# Patient Record
Sex: Female | Born: 1955 | Race: Black or African American | Hispanic: No | Smoking: Current every day smoker
Health system: Southern US, Community
[De-identification: ages and names within clinical notes are randomized; demographics above are authoritative.]

## PROBLEM LIST (undated history)

## (undated) DIAGNOSIS — E7800 Pure hypercholesterolemia, unspecified: Secondary | ICD-10-CM

## (undated) DIAGNOSIS — J449 Chronic obstructive pulmonary disease, unspecified: Secondary | ICD-10-CM

## (undated) DIAGNOSIS — I2510 Atherosclerotic heart disease of native coronary artery without angina pectoris: Secondary | ICD-10-CM

## (undated) DIAGNOSIS — I700 Atherosclerosis of aorta: Secondary | ICD-10-CM

## (undated) DIAGNOSIS — M1990 Unspecified osteoarthritis, unspecified site: Secondary | ICD-10-CM

## (undated) DIAGNOSIS — R0600 Dyspnea, unspecified: Secondary | ICD-10-CM

## (undated) DIAGNOSIS — I10 Essential (primary) hypertension: Secondary | ICD-10-CM

## (undated) DIAGNOSIS — E119 Type 2 diabetes mellitus without complications: Secondary | ICD-10-CM

## (undated) DIAGNOSIS — I340 Nonrheumatic mitral (valve) insufficiency: Secondary | ICD-10-CM

## (undated) DIAGNOSIS — I5189 Other ill-defined heart diseases: Secondary | ICD-10-CM

## (undated) HISTORY — PX: ABDOMINAL HYSTERECTOMY: SHX81

---

## 2007-12-03 ENCOUNTER — Emergency Department: Payer: Self-pay | Admitting: Emergency Medicine

## 2009-01-07 ENCOUNTER — Emergency Department: Payer: Self-pay | Admitting: Unknown Physician Specialty

## 2014-09-26 DIAGNOSIS — I10 Essential (primary) hypertension: Secondary | ICD-10-CM | POA: Diagnosis present

## 2014-09-26 DIAGNOSIS — R002 Palpitations: Secondary | ICD-10-CM | POA: Insufficient documentation

## 2015-06-20 ENCOUNTER — Emergency Department: Payer: Self-pay

## 2015-06-20 ENCOUNTER — Encounter: Payer: Self-pay | Admitting: *Deleted

## 2015-06-20 ENCOUNTER — Emergency Department
Admission: EM | Admit: 2015-06-20 | Discharge: 2015-06-20 | Disposition: A | Discharge status: Home / Self Care | Payer: Self-pay | Attending: Emergency Medicine | Admitting: Emergency Medicine

## 2015-06-20 DIAGNOSIS — M1611 Unilateral primary osteoarthritis, right hip: Secondary | ICD-10-CM | POA: Insufficient documentation

## 2015-06-20 DIAGNOSIS — M1990 Unspecified osteoarthritis, unspecified site: Secondary | ICD-10-CM

## 2015-06-20 DIAGNOSIS — Z79899 Other long term (current) drug therapy: Secondary | ICD-10-CM | POA: Insufficient documentation

## 2015-06-20 DIAGNOSIS — I10 Essential (primary) hypertension: Secondary | ICD-10-CM | POA: Insufficient documentation

## 2015-06-20 DIAGNOSIS — Z720 Tobacco use: Secondary | ICD-10-CM | POA: Insufficient documentation

## 2015-06-20 DIAGNOSIS — Z791 Long term (current) use of non-steroidal anti-inflammatories (NSAID): Secondary | ICD-10-CM | POA: Insufficient documentation

## 2015-06-20 DIAGNOSIS — E7800 Pure hypercholesterolemia, unspecified: Secondary | ICD-10-CM | POA: Insufficient documentation

## 2015-06-20 DIAGNOSIS — R52 Pain, unspecified: Secondary | ICD-10-CM

## 2015-06-20 HISTORY — DX: Pure hypercholesterolemia, unspecified: E78.00

## 2015-06-20 HISTORY — DX: Essential (primary) hypertension: I10

## 2015-06-20 MED ORDER — MELOXICAM 7.5 MG PO TABS
7.5000 mg | ORAL_TABLET | Freq: Once | ORAL | Status: AC
  Administered 2015-06-20: 7.5 mg via ORAL

## 2015-06-20 MED ORDER — MELOXICAM 7.5 MG PO TABS
ORAL_TABLET | ORAL | Status: AC
  Administered 2015-06-20: 7.5 mg via ORAL
  Filled 2015-06-20: qty 1

## 2015-06-20 MED ORDER — MELOXICAM 15 MG PO TABS: 15.0000 mg | ORAL_TABLET | Freq: Every day | ORAL | Status: DC

## 2015-06-20 NOTE — ED Notes (Signed)
Has had pain in right leg, right hip, right knee, ache in hands past month

## 2015-06-20 NOTE — ED Provider Notes (Signed)
[facility] Emergency Department Provider Note  ____________________________________________  Time seen: Approximately 6:07 PM  I have reviewed the triage vital signs and the nursing notes.   HISTORY  Chief Complaint Muscle Pain    HPI Judy Martin is a 59 y.o. female presenting with a 1 year history of right leg pain and numbness. She states she has been experiencing a sharp, burning pain when ambulating that rates at 7/10. At rest, the pain is mostly relieved but the leg becomes stiff. She was seen about a year ago for this issue and x-rays were taken. She was told at this time that she had arthritis and given a medication that she can't remember the name of. The medication worked well, but she discontinued it about a month ago because she felt she was covering up a bigger issue. Since being off the medication, she has begun to experience b/l hand stiffness and numbness with intermittent decreases in grip strength and some locking up of the joints. These symptoms are relived by 500 mg Tylenol and a baby aspirin. Patient wanted to come get checked out today because she lives alone and wanted to make sure nothing serious was going on.    Past Medical History  Diagnosis Date  . Hypertension   . Hypercholesteremia     Patient Active Problem List   Diagnosis Date Noted  . Hypercholesteremia     Past Surgical History  Procedure Laterality Date  . Abdominal hysterectomy      Current Outpatient Rx  Name  Route  Sig  Dispense  Refill  . lisinopril (PRINIVIL,ZESTRIL) 20 MG tablet   Oral   Take 20 mg by mouth daily.         . simvastatin (ZOCOR) 40 MG tablet   Oral   Take 40 mg by mouth daily.         . meloxicam (MOBIC) 15 MG tablet   Oral   Take 1 tablet (15 mg total) by mouth daily.   30 tablet   2     Allergies Review of patient's allergies indicates no known allergies.  No family history on file.  Social History History  Substance  Use Topics  . Smoking status: Current Every Day Smoker  . Smokeless tobacco: Not on file  . Alcohol Use: Yes    Review of Systems Constitutional: No fever/chills Eyes: No visual changes. ENT: No sore throat. Cardiovascular: Denies chest pain. Respiratory: Denies shortness of breath. Gastrointestinal: No abdominal pain.  No nausea, no vomiting.  No diarrhea.  No constipation. Genitourinary: Negative for dysuria. Musculoskeletal: Negative for back pain. Positive for right leg pain and b/l hand stiffness.  Skin: Negative for rash. Neurological: Negative for headaches or focal weakness. Positive for b/l hand and right leg numbness.  10-point ROS otherwise negative.  ____________________________________________   PHYSICAL EXAM:  VITAL SIGNS: ED Triage Vitals  Enc Vitals Group     BP 06/20/15 1559 184/97 mmHg     Pulse Rate 06/20/15 1559 86     Resp 06/20/15 1559 20     Temp 06/20/15 1559 98.7 F (37.1 C)     Temp Source 06/20/15 1559 Oral     SpO2 06/20/15 1559 94 %     Weight 06/20/15 1559 167 lb (75.751 kg)     Height 06/20/15 1559 5\' 5"  (1.651 m)     Head Cir --      Peak Flow --      Pain Score 06/20/15 1607 8  Pain Loc --      Pain Edu? --      Excl. in [place]? --     Constitutional: Alert and oriented. Well appearing and in no acute distress. Eyes: Conjunctivae are normal. PERRL. EOMI. Head: Atraumatic. Nose: No congestion/rhinnorhea. Mouth/Throat: Mucous membranes are moist.  Oropharynx non-erythematous. Neck: No stridor. No cervical spine tenderness to palpation. Cardiovascular: Normal rate. Grossly normal heart sounds. Good peripheral circulation. Respiratory: Normal respiratory effort.  No retractions. Lungs CTAB. Gastrointestinal: Soft and nontender. No distention. No CVA tenderness. Musculoskeletal: ROM full and equal in b/l upper and lower extremities. Straight leg raise negative. No spinal TTP.  Neurologic:  Normal speech and language. No gross focal  neurologic deficits are appreciated. Speech is normal. Decreased sensation of left leg. Strength equal and appropriate in b/l upper and lower extremities.  Skin:  Skin is warm, dry and intact. No rash noted. Psychiatric: Mood and affect are normal. Speech and behavior are normal.  ____________________________________________   LABS (all labs ordered are listed, but only abnormal results are displayed)  Labs Reviewed - No data to display ____________________________________________  EKG  Not applicable ____________________________________________  RADIOLOGY  X-ray shows arthritis in the right femur. ____________________________________________    INITIAL IMPRESSION / ASSESSMENT AND PLAN / ED COURSE  Pertinent labs & imaging results that were available during my care of the patient were reviewed by me and considered in my medical decision making (see chart for details).  Diagnosed with arthralgia. Rx given for Mobic 15 mg 1 by mouth daily. One now prior to leaving. Patient to return follow up with PCP as directed. Patient understands she will return with any worsening symptomology and voices no other emergency medical complaints at this time ____________________________________________   FINAL CLINICAL IMPRESSION(S) / ED DIAGNOSES  Final diagnoses:  Pain aggravated by activities of daily living  Arthritis      Evangeline Dakin, PA-C 06/21/15 1610  Emily Filbert, MD 06/21/15 [phone]

## 2015-06-20 NOTE — ED Notes (Signed)
States she is having some pain from right hip into right leg for couple of mos. Now having pain to right arm grips are strong and equal

## 2015-06-20 NOTE — Discharge Instructions (Signed)
Arthritis, Nonspecific °Arthritis is inflammation of a joint. This usually means pain, redness, warmth or swelling are present. One or more joints may be involved. There are a number of types of arthritis. Your caregiver may not be able to tell what type of arthritis you have right away. °CAUSES  °The most common cause of arthritis is the wear and tear on the joint (osteoarthritis). This causes damage to the cartilage, which can break down over time. The knees, hips, back and neck are most often affected by this type of arthritis. °Other types of arthritis and common causes of joint pain include: °· Sprains and other injuries near the joint. Sometimes minor sprains and injuries cause pain and swelling that develop hours later. °· Rheumatoid arthritis. This affects hands, feet and knees. It usually affects both sides of your body at the same time. It is often associated with chronic ailments, fever, weight loss and general weakness. °· Crystal arthritis. Gout and pseudo gout can cause occasional acute severe pain, redness and swelling in the foot, ankle, or knee. °· Infectious arthritis. Bacteria can get into a joint through a break in overlying skin. This can cause infection of the joint. Bacteria and viruses can also spread through the blood and affect your joints. °· Drug, infectious and allergy reactions. Sometimes joints can become mildly painful and slightly swollen with these types of illnesses. °SYMPTOMS  °· Pain is the main symptom. °· Your joint or joints can also be red, swollen and warm or hot to the touch. °· You may have a fever with certain types of arthritis, or even feel overall ill. °· The joint with arthritis will hurt with movement. Stiffness is present with some types of arthritis. °DIAGNOSIS  °Your caregiver will suspect arthritis based on your description of your symptoms and on your exam. Testing may be needed to find the type of arthritis: °· Blood and sometimes urine tests. °· X-ray tests  and sometimes CT or MRI scans. °· Removal of fluid from the joint (arthrocentesis) is done to check for bacteria, crystals or other causes. Your caregiver (or a specialist) will numb the area over the joint with a local anesthetic, and use a needle to remove joint fluid for examination. This procedure is only minimally uncomfortable. °· Even with these tests, your caregiver may not be able to tell what kind of arthritis you have. Consultation with a specialist (rheumatologist) may be helpful. °TREATMENT  °Your caregiver will discuss with you treatment specific to your type of arthritis. If the specific type cannot be determined, then the following general recommendations may apply. °Treatment of severe joint pain includes: °· Rest. °· Elevation. °· Anti-inflammatory medication (for example, ibuprofen) may be prescribed. Avoiding activities that cause increased pain. °· Only take over-the-counter or prescription medicines for pain and discomfort as recommended by your caregiver. °· Cold packs over an inflamed joint may be used for 10 to 15 minutes every hour. Hot packs sometimes feel better, but do not use overnight. Do not use hot packs if you are diabetic without your caregiver's permission. °· A cortisone shot into arthritic joints may help reduce pain and swelling. °· Any acute arthritis that gets worse over the next 1 to 2 days needs to be looked at to be sure there is no joint infection. °Long-term arthritis treatment involves modifying activities and lifestyle to reduce joint stress jarring. This can include weight loss. Also, exercise is needed to nourish the joint cartilage and remove waste. This helps keep the muscles   around the joint strong. °HOME CARE INSTRUCTIONS  °· Do not take aspirin to relieve pain if gout is suspected. This elevates uric acid levels. °· Only take over-the-counter or prescription medicines for pain, discomfort or fever as directed by your caregiver. °· Rest the joint as much as  possible. °· If your joint is swollen, keep it elevated. °· Use crutches if the painful joint is in your leg. °· Drinking plenty of fluids may help for certain types of arthritis. °· Follow your caregiver's dietary instructions. °· Try low-impact exercise such as: °¨ Swimming. °¨ Water aerobics. °¨ Biking. °¨ Walking. °· Morning stiffness is often relieved by a warm shower. °· Put your joints through regular range-of-motion. °SEEK MEDICAL CARE IF:  °· You do not feel better in 24 hours or are getting worse. °· You have side effects to medications, or are not getting better with treatment. °SEEK IMMEDIATE MEDICAL CARE IF:  °· You have a fever. °· You develop severe joint pain, swelling or redness. °· Many joints are involved and become painful and swollen. °· There is severe back pain and/or leg weakness. °· You have loss of bowel or bladder control. °Document Released: 01/20/2005 Document Revised: 03/06/2012 Document Reviewed: 02/05/2009 °[employer]® Patient Information ©2015 [employer]. This information is not intended to replace advice given to you by your health care provider. Make sure you discuss any questions you have with your health care provider. ° °

## 2019-08-23 ENCOUNTER — Emergency Department: Payer: No Typology Code available for payment source

## 2019-08-23 ENCOUNTER — Other Ambulatory Visit: Payer: Self-pay

## 2019-08-23 ENCOUNTER — Emergency Department
Admission: EM | Admit: 2019-08-23 | Discharge: 2019-08-23 | Disposition: A | Discharge status: Home / Self Care | Payer: No Typology Code available for payment source | Attending: Emergency Medicine | Admitting: Emergency Medicine

## 2019-08-23 ENCOUNTER — Encounter: Payer: Self-pay | Admitting: Emergency Medicine

## 2019-08-23 DIAGNOSIS — M7918 Myalgia, other site: Secondary | ICD-10-CM

## 2019-08-23 DIAGNOSIS — Y93I9 Activity, other involving external motion: Secondary | ICD-10-CM | POA: Diagnosis not present

## 2019-08-23 DIAGNOSIS — M791 Myalgia, unspecified site: Secondary | ICD-10-CM | POA: Diagnosis not present

## 2019-08-23 DIAGNOSIS — M549 Dorsalgia, unspecified: Secondary | ICD-10-CM | POA: Diagnosis present

## 2019-08-23 DIAGNOSIS — Z79899 Other long term (current) drug therapy: Secondary | ICD-10-CM | POA: Diagnosis not present

## 2019-08-23 DIAGNOSIS — Y999 Unspecified external cause status: Secondary | ICD-10-CM | POA: Insufficient documentation

## 2019-08-23 DIAGNOSIS — Y92410 Unspecified street and highway as the place of occurrence of the external cause: Secondary | ICD-10-CM | POA: Diagnosis not present

## 2019-08-23 DIAGNOSIS — I10 Essential (primary) hypertension: Secondary | ICD-10-CM | POA: Insufficient documentation

## 2019-08-23 DIAGNOSIS — F17210 Nicotine dependence, cigarettes, uncomplicated: Secondary | ICD-10-CM | POA: Diagnosis not present

## 2019-08-23 MED ORDER — METHOCARBAMOL 500 MG PO TABS: 500.0000 mg | ORAL_TABLET | Freq: Every evening | ORAL | 0 refills | Status: DC | PRN

## 2019-08-23 MED ORDER — NAPROXEN 500 MG PO TABS: 500.0000 mg | ORAL_TABLET | Freq: Two times a day (BID) | ORAL | 0 refills | Status: DC
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
## 2019-08-23 NOTE — Discharge Instructions (Addendum)
Follow-up with your primary care provider if any continued problems.  Begin taking your medication as directed.  These medicines were sent to the pharmacy.  There is a muscle relaxant included with this prescription which should only be taken at bedtime as it may cause drowsiness and increase her risk for falling.  You may use ice or heat to your neck and muscles as needed for discomfort.

## 2019-08-23 NOTE — ED Notes (Signed)
Awaiting x-rays

## 2022-05-04 ENCOUNTER — Other Ambulatory Visit: Payer: Self-pay | Admitting: Internal Medicine

## 2022-05-04 DIAGNOSIS — Z1231 Encounter for screening mammogram for malignant neoplasm of breast: Secondary | ICD-10-CM

## 2022-05-12 ENCOUNTER — Ambulatory Visit
Admission: RE | Admit: 2022-05-12 | Discharge: 2022-05-12 | Disposition: A | Discharge status: Home / Self Care | Payer: Medicare Other | Source: Ambulatory Visit | Attending: Internal Medicine | Admitting: Internal Medicine

## 2022-05-12 DIAGNOSIS — Z1231 Encounter for screening mammogram for malignant neoplasm of breast: Secondary | ICD-10-CM | POA: Insufficient documentation

## 2022-05-14 ENCOUNTER — Other Ambulatory Visit: Payer: Self-pay | Admitting: *Deleted

## 2022-05-14 ENCOUNTER — Inpatient Hospital Stay
Admission: RE | Admit: 2022-05-14 | Discharge: 2022-05-14 | Disposition: A | Discharge status: Home / Self Care | Payer: Self-pay | Source: Ambulatory Visit | Attending: *Deleted | Admitting: *Deleted

## 2022-05-14 DIAGNOSIS — Z1231 Encounter for screening mammogram for malignant neoplasm of breast: Secondary | ICD-10-CM

## 2022-05-20 ENCOUNTER — Other Ambulatory Visit: Payer: Self-pay | Admitting: Internal Medicine

## 2022-05-20 DIAGNOSIS — R928 Other abnormal and inconclusive findings on diagnostic imaging of breast: Secondary | ICD-10-CM

## 2022-05-20 DIAGNOSIS — N63 Unspecified lump in unspecified breast: Secondary | ICD-10-CM

## 2022-06-02 ENCOUNTER — Ambulatory Visit
Admission: RE | Admit: 2022-06-02 | Discharge: 2022-06-02 | Disposition: A | Discharge status: Home / Self Care | Payer: Medicare Other | Source: Ambulatory Visit | Attending: Internal Medicine | Admitting: Internal Medicine

## 2022-06-02 DIAGNOSIS — R928 Other abnormal and inconclusive findings on diagnostic imaging of breast: Secondary | ICD-10-CM | POA: Diagnosis present

## 2022-06-02 DIAGNOSIS — N63 Unspecified lump in unspecified breast: Secondary | ICD-10-CM

## 2022-07-04 ENCOUNTER — Emergency Department: Payer: Medicare Other

## 2022-07-04 ENCOUNTER — Emergency Department
Admission: EM | Admit: 2022-07-04 | Discharge: 2022-07-04 | Disposition: A | Discharge status: Home / Self Care | Payer: Medicare Other | Attending: Emergency Medicine | Admitting: Emergency Medicine

## 2022-07-04 ENCOUNTER — Other Ambulatory Visit: Payer: Self-pay

## 2022-07-04 DIAGNOSIS — R0602 Shortness of breath: Secondary | ICD-10-CM | POA: Diagnosis present

## 2022-07-04 DIAGNOSIS — Z20822 Contact with and (suspected) exposure to covid-19: Secondary | ICD-10-CM | POA: Insufficient documentation

## 2022-07-04 LAB — BASIC METABOLIC PANEL
Anion gap: 8 (ref 5–15)
BUN: 9 mg/dL (ref 8–23)
CO2: 23 mmol/L (ref 22–32)
Calcium: 9.3 mg/dL (ref 8.9–10.3)
Chloride: 112 mmol/L — ABNORMAL HIGH (ref 98–111)
Creatinine, Ser: 0.74 mg/dL (ref 0.44–1.00)
GFR, Estimated: >60 mL/min (ref >60)
Glucose, Bld: 115 mg/dL — ABNORMAL HIGH (ref 70–99)
Potassium: 4.1 mmol/L (ref 3.5–5.1)
Sodium: 143 mmol/L (ref 135–145)

## 2022-07-04 LAB — SARS CORONAVIRUS 2 BY RT PCR: SARS Coronavirus 2 by RT PCR: NEGATIVE

## 2022-07-04 LAB — CBC
HCT: 48.9 % — ABNORMAL HIGH (ref 36.0–46.0)
Hemoglobin: 16.4 g/dL — ABNORMAL HIGH (ref 12.0–15.0)
MCH: 33.5 pg (ref 26.0–34.0)
MCHC: 33.5 g/dL (ref 30.0–36.0)
MCV: 100 fL (ref 80.0–100.0)
Platelets: 247 10*3/uL (ref 150–400)
RBC: 4.89 MIL/uL (ref 3.87–5.11)
RDW: 13.2 % (ref 11.5–15.5)
WBC: 10 10*3/uL (ref 4.0–10.5)
nRBC: 0 % (ref 0.0–0.2)

## 2022-07-04 LAB — D-DIMER, QUANTITATIVE: D-Dimer, Quant: 0.48 ug/mL-FEU (ref 0.00–0.50)

## 2022-07-04 MED ORDER — IPRATROPIUM-ALBUTEROL 0.5-2.5 (3) MG/3ML IN SOLN
3.0000 mL | Freq: Once | RESPIRATORY_TRACT | Status: AC
  Administered 2022-07-04: 3 mL via RESPIRATORY_TRACT
  Filled 2022-07-04: qty 3

## 2022-07-04 MED ORDER — ALBUTEROL SULFATE (2.5 MG/3ML) 0.083% IN NEBU: 3.0000 mL | INHALATION_SOLUTION | RESPIRATORY_TRACT | Status: DC | PRN

## 2022-07-04 MED ORDER — ACETAMINOPHEN 500 MG PO TABS
1000.0000 mg | ORAL_TABLET | Freq: Once | ORAL | Status: AC
  Administered 2022-07-04: 1000 mg via ORAL
  Filled 2022-07-04: qty 2

## 2022-07-04 MED ORDER — ALBUTEROL SULFATE HFA 108 (90 BASE) MCG/ACT IN AERS
2.0000 | INHALATION_SPRAY | Freq: Four times a day (QID) | RESPIRATORY_TRACT | 2 refills | Status: DC | PRN
Start: 2022-07-04 — End: 2023-07-21

## 2022-07-04 MED ORDER — METHYLPREDNISOLONE SODIUM SUCC 125 MG IJ SOLR
125.0000 mg | Freq: Once | INTRAMUSCULAR | Status: AC
Start: 2022-07-04 — End: 2022-07-04
  Administered 2022-07-04: 125 mg via INTRAVENOUS
  Filled 2022-07-04: qty 2

## 2022-07-04 MED ORDER — PREDNISONE 10 MG (21) PO TBPK: ORAL_TABLET | ORAL | 0 refills | Status: DC

## 2022-07-04 NOTE — Discharge Instructions (Signed)
Please seek medical attention for any high fevers, chest pain, shortness of breath, change in behavior, persistent vomiting, bloody stool or any other new or concerning symptoms.  

## 2022-07-04 NOTE — ED Provider Notes (Signed)
[facility] Provider Note    Event Date/Time   First MD Initiated Contact with Patient 07/04/22 2035     (approximate)   History   Shortness of Breath   HPI {Remember to add pertinent medical, surgical, social, and/or OB history to HPI:1} Judy Martin is a 66 y.o. female  ***       Physical Exam   Triage Vital Signs: ED Triage Vitals  Enc Vitals Group     BP 07/04/22 1957 (!) 159/95     Pulse Rate 07/04/22 1957 (!) 101     Resp 07/04/22 1957 20     Temp 07/04/22 1957 98.7 F (37.1 C)     Temp Source 07/04/22 1957 Oral     SpO2 07/04/22 1953 92 %     Weight 07/04/22 1954 160 lb (72.6 kg)     Height 07/04/22 1954 5\' 5"  (1.651 m)     Head Circumference --      Peak Flow --      Pain Score 07/04/22 1954 0     Pain Loc --      Pain Edu? --      Excl. in [place]? --     Most recent vital signs: Vitals:   07/04/22 2030 07/04/22 2032  BP: (!) 165/88   Pulse: (!) 101   Resp: 20   Temp:    SpO2: 90% 95%    {Only need to document appropriate and relevant physical exam:1} General: Awake, no distress. *** CV:  Good peripheral perfusion. *** Resp:  Normal effort. *** Abd:  No distention. *** Other:  ***   ED Results / Procedures / Treatments   Labs (all labs ordered are listed, but only abnormal results are displayed) Labs Reviewed  BASIC METABOLIC PANEL - Abnormal; Notable for the following components:      Result Value   Chloride 112 (*)    Glucose, Bld 115 (*)    All other components within normal limits  CBC - Abnormal; Notable for the following components:   Hemoglobin 16.4 (*)    HCT 48.9 (*)    All other components within normal limits  D-DIMER, QUANTITATIVE     EKG  I, 2033, attending physician, personally viewed and interpreted this EKG  EKG Time: 1957 Rate: 101 Rhythm: sinus tachycardia with pac Axis: normal Intervals: qtc 453 QRS: narrow, q waves v1 ST changes: no st elevation Impression: abnormal  ekg   RADIOLOGY I independently interpreted and visualized the CXR. My interpretation: No pneumonia. No pneumothorax. Radiology interpretation:  IMPRESSION:  Cardiomegaly and pulmonary hyperinflation without acute abnormality  of the lungs.     PROCEDURES:  Critical Care performed: {CriticalCareYesNo:19197::"Yes, see critical care procedure note(s)","No"}  Procedures   MEDICATIONS ORDERED IN ED: Medications  albuterol (PROVENTIL) (2.5 MG/3ML) 0.083% nebulizer solution 3 mL (has no administration in time range)     IMPRESSION / MDM / ASSESSMENT AND PLAN / ED COURSE  I reviewed the triage vital signs and the nursing notes.                              Differential diagnosis includes, but is not limited to, ***  Patient's presentation is most consistent with {EM COPA:27473}  {If the patient is on the monitor, remove the brackets and asterisks on the sentence below and remember to document it as a Procedure as well. Otherwise delete the sentence below:1} {**The patient is  on the cardiac monitor to evaluate for evidence of arrhythmia and/or significant heart rate changes.**} {Remember to include, when applicable, any/all of the following data: independent review of imaging independent review of labs (comment specifically on pertinent positives and negatives) review of specific prior hospitalizations, PCP/specialist notes, etc. discuss meds given and prescribed document any discussion with consultants (including hospitalists) any clinical decision tools you used and why (PECARN, NEXUS, etc.) did you consider admitting the patient? document social determinants of health affecting patient's care (homelessness, inability to follow up in a timely fashion, etc) document any pre-existing conditions increasing risk on current visit (e.g. diabetes and HTN increasing danger of high-risk chest pain/ACS) describes what meds you gave (especially parenteral) and why any other  interventions?:1}     FINAL CLINICAL IMPRESSION(S) / ED DIAGNOSES   Final diagnoses:  None     Rx / DC Orders   ED Discharge Orders     None        Note:  This document was prepared using Dragon voice recognition software and may include unintentional dictation errors.

## 2022-07-04 NOTE — ED Notes (Signed)
Patient transported to X-ray. RN aware of pt.

## 2022-07-04 NOTE — ED Notes (Signed)
Pt sats decreased to 89% on room air, pt placed on O2 2 L [place], sats improved to 98% on 2 l [place]

## 2022-07-04 NOTE — ED Triage Notes (Signed)
Pt with c/o Shob since last evening, pt is current smoker, about a pack a day. Pt denies swelling or asthma. Pt states Sob is worse when walking. Pt denies pain.

## 2023-01-26 ENCOUNTER — Observation Stay
Admission: EM | Admit: 2023-01-26 | Discharge: 2023-01-27 | Disposition: A | Discharge status: Home / Self Care | Payer: Medicare Other | Attending: Obstetrics and Gynecology | Admitting: Obstetrics and Gynecology

## 2023-01-26 ENCOUNTER — Other Ambulatory Visit: Payer: Self-pay

## 2023-01-26 ENCOUNTER — Emergency Department: Payer: Medicare Other

## 2023-01-26 ENCOUNTER — Observation Stay (HOSPITAL_BASED_OUTPATIENT_CLINIC_OR_DEPARTMENT_OTHER)
Admit: 2023-01-26 | Discharge: 2023-01-26 | Disposition: A | Discharge status: Home / Self Care | Payer: Medicare Other | Attending: Internal Medicine | Admitting: Internal Medicine

## 2023-01-26 ENCOUNTER — Observation Stay: Payer: Medicare Other

## 2023-01-26 DIAGNOSIS — J9601 Acute respiratory failure with hypoxia: Secondary | ICD-10-CM | POA: Diagnosis not present

## 2023-01-26 DIAGNOSIS — Z79899 Other long term (current) drug therapy: Secondary | ICD-10-CM | POA: Diagnosis not present

## 2023-01-26 DIAGNOSIS — D751 Secondary polycythemia: Secondary | ICD-10-CM | POA: Diagnosis not present

## 2023-01-26 DIAGNOSIS — R0602 Shortness of breath: Secondary | ICD-10-CM | POA: Insufficient documentation

## 2023-01-26 DIAGNOSIS — J479 Bronchiectasis, uncomplicated: Secondary | ICD-10-CM | POA: Diagnosis not present

## 2023-01-26 DIAGNOSIS — F17290 Nicotine dependence, other tobacco product, uncomplicated: Secondary | ICD-10-CM | POA: Diagnosis not present

## 2023-01-26 DIAGNOSIS — Z1152 Encounter for screening for COVID-19: Secondary | ICD-10-CM | POA: Insufficient documentation

## 2023-01-26 DIAGNOSIS — J441 Chronic obstructive pulmonary disease with (acute) exacerbation: Secondary | ICD-10-CM | POA: Diagnosis not present

## 2023-01-26 DIAGNOSIS — I10 Essential (primary) hypertension: Secondary | ICD-10-CM | POA: Diagnosis not present

## 2023-01-26 DIAGNOSIS — J471 Bronchiectasis with (acute) exacerbation: Secondary | ICD-10-CM

## 2023-01-26 DIAGNOSIS — E876 Hypokalemia: Secondary | ICD-10-CM | POA: Diagnosis not present

## 2023-01-26 DIAGNOSIS — E119 Type 2 diabetes mellitus without complications: Secondary | ICD-10-CM | POA: Diagnosis not present

## 2023-01-26 DIAGNOSIS — I272 Pulmonary hypertension, unspecified: Secondary | ICD-10-CM | POA: Diagnosis not present

## 2023-01-26 LAB — CBC
HCT: 45.3 % (ref 36.0–46.0)
Hemoglobin: 15.3 g/dL — ABNORMAL HIGH (ref 12.0–15.0)
MCH: 33.8 pg (ref 26.0–34.0)
MCHC: 33.8 g/dL (ref 30.0–36.0)
MCV: 100 fL (ref 80.0–100.0)
Platelets: 329 10*3/uL (ref 150–400)
RBC: 4.53 MIL/uL (ref 3.87–5.11)
RDW: 13.7 % (ref 11.5–15.5)
WBC: 7.6 10*3/uL (ref 4.0–10.5)
nRBC: 0 % (ref 0.0–0.2)

## 2023-01-26 LAB — BRAIN NATRIURETIC PEPTIDE: B Natriuretic Peptide: 66.7 pg/mL (ref 0.0–100.0)

## 2023-01-26 LAB — BASIC METABOLIC PANEL
Anion gap: 6 (ref 5–15)
BUN: 9 mg/dL (ref 8–23)
CO2: 31 mmol/L (ref 22–32)
Calcium: 8.9 mg/dL (ref 8.9–10.3)
Chloride: 104 mmol/L (ref 98–111)
Creatinine, Ser: 0.7 mg/dL (ref 0.44–1.00)
GFR, Estimated: >60 mL/min (ref >60)
Glucose, Bld: 105 mg/dL — ABNORMAL HIGH (ref 70–99)
Potassium: 2.8 mmol/L — ABNORMAL LOW (ref 3.5–5.1)
Sodium: 141 mmol/L (ref 135–145)

## 2023-01-26 LAB — RESP PANEL BY RT-PCR (RSV, FLU A&B, COVID)  RVPGX2
Influenza A by PCR: NEGATIVE
Influenza B by PCR: NEGATIVE
Resp Syncytial Virus by PCR: NEGATIVE
SARS Coronavirus 2 by RT PCR: NEGATIVE

## 2023-01-26 LAB — D-DIMER, QUANTITATIVE: D-Dimer, Quant: 0.46 ug/mL-FEU (ref 0.00–0.50)

## 2023-01-26 MED ORDER — POTASSIUM CHLORIDE CRYS ER 20 MEQ PO TBCR
40.0000 meq | EXTENDED_RELEASE_TABLET | Freq: Once | ORAL | Status: AC
  Administered 2023-01-26: 40 meq via ORAL
  Filled 2023-01-26: qty 2

## 2023-01-26 MED ORDER — AMLODIPINE BESYLATE 10 MG PO TABS
10.0000 mg | ORAL_TABLET | Freq: Every day | ORAL | Status: DC
  Administered 2023-01-26: 10 mg via ORAL
  Filled 2023-01-26: qty 1

## 2023-01-26 MED ORDER — ASPIRIN 81 MG PO TBEC
81.0000 mg | DELAYED_RELEASE_TABLET | Freq: Every day | ORAL | Status: DC
  Administered 2023-01-26 – 2023-01-27 (×2): 81 mg via ORAL
  Filled 2023-01-26 (×2): qty 1

## 2023-01-26 MED ORDER — IPRATROPIUM-ALBUTEROL 0.5-2.5 (3) MG/3ML IN SOLN
3.0000 mL | Freq: Once | RESPIRATORY_TRACT | Status: AC
  Administered 2023-01-26: 3 mL via RESPIRATORY_TRACT
  Filled 2023-01-26: qty 3

## 2023-01-26 MED ORDER — PREDNISONE 20 MG PO TABS
40.0000 mg | ORAL_TABLET | Freq: Every day | ORAL | Status: DC
  Administered 2023-01-27: 40 mg via ORAL
  Filled 2023-01-26: qty 2

## 2023-01-26 MED ORDER — ATENOLOL 25 MG PO TABS
100.0000 mg | ORAL_TABLET | Freq: Every day | ORAL | Status: DC
  Administered 2023-01-27: 100 mg via ORAL
  Filled 2023-01-26: qty 4

## 2023-01-26 MED ORDER — ENOXAPARIN SODIUM 40 MG/0.4ML IJ SOSY
40.0000 mg | PREFILLED_SYRINGE | INTRAMUSCULAR | Status: DC
  Administered 2023-01-26: 40 mg via SUBCUTANEOUS
  Filled 2023-01-26: qty 0.4

## 2023-01-26 MED ORDER — IRBESARTAN 150 MG PO TABS
300.0000 mg | ORAL_TABLET | Freq: Every day | ORAL | Status: DC
  Administered 2023-01-26 – 2023-01-27 (×2): 300 mg via ORAL
  Filled 2023-01-26 (×2): qty 2

## 2023-01-26 MED ORDER — POTASSIUM CHLORIDE CRYS ER 10 MEQ PO TBCR
10.0000 meq | EXTENDED_RELEASE_TABLET | Freq: Every day | ORAL | Status: DC
  Administered 2023-01-27: 10 meq via ORAL
  Filled 2023-01-26: qty 1

## 2023-01-26 MED ORDER — ATORVASTATIN CALCIUM 20 MG PO TABS
80.0000 mg | ORAL_TABLET | Freq: Every day | ORAL | Status: DC
  Administered 2023-01-26: 80 mg via ORAL
  Filled 2023-01-26: qty 4

## 2023-01-26 MED ORDER — UMECLIDINIUM-VILANTEROL 62.5-25 MCG/ACT IN AEPB
1.0000 | INHALATION_SPRAY | Freq: Every day | RESPIRATORY_TRACT | Status: DC
  Administered 2023-01-27: 1 via RESPIRATORY_TRACT
  Filled 2023-01-26: qty 14

## 2023-01-26 MED ORDER — IPRATROPIUM-ALBUTEROL 0.5-2.5 (3) MG/3ML IN SOLN
3.0000 mL | Freq: Four times a day (QID) | RESPIRATORY_TRACT | Status: DC
  Administered 2023-01-26 – 2023-01-27 (×3): 3 mL via RESPIRATORY_TRACT
  Filled 2023-01-26 (×2): qty 3

## 2023-01-26 MED ORDER — METHYLPREDNISOLONE SODIUM SUCC 125 MG IJ SOLR
125.0000 mg | Freq: Once | INTRAMUSCULAR | Status: AC
Start: 2023-01-26 — End: 2023-01-26
  Administered 2023-01-26: 125 mg via INTRAVENOUS
  Filled 2023-01-26: qty 2

## 2023-01-26 NOTE — Assessment & Plan Note (Addendum)
Blood pressure above goal on admission.  Potentially will need further titration of her antihypertensives, but will continue home doses for now.  - Continue home antihypertensives

## 2023-01-26 NOTE — ED Triage Notes (Signed)
First Nurse Note:  C/o difficulty breathing x 1 month.  AAOx3.  Skin warm and dry. NAD

## 2023-01-26 NOTE — Assessment & Plan Note (Addendum)
Likely in the setting of COPD exacerbation with evidence of diaphragmatic flattening on chest x-ray, however interestingly, she does not have any wheezing on examination today.  Per chart review, she had an ED visit several months ago at which time she was noted to be wheezing extensively though.  She will need formal PFTs 4 to 6 weeks after resolution of acute issues.  For further evaluation of other etiology, such as ILD, will order CT chest.  - Continue supplemental oxygen to maintain oxygen saturation above 88% - Wean as tolerated - CT chest without contrast - S/p Solu-Medrol 125 mg once - Start prednisone 40 mg tomorrow to complete a 5-day course - RVP pending - DuoNebs every 6 hours - Start maintenance bronchodilators tomorrow

## 2023-01-26 NOTE — H&P (Signed)
History and Physical    PatientDimitri Martin IEP:[id number] DOB: 09/25/1956 DOA: 01/26/2023 DOS: the patient was seen and examined on 01/26/2023 PCP: [facility]  Patient coming from: Home  Chief Complaint:  Chief Complaint  Patient presents with   Shortness of Breath   HPI: Judy Martin is a 67 y.o. female with medical history significant of HTN and HLD who presents to the ED with c/o SOB.   Judy Martin states that for the last 1 month, she has been experiencing gradually worsening shortness of breath both at rest and with exertion.  She denies any cough, rhinorrhea, congestion, or chest pain.  She denies any lower extremity swelling or orthopnea.  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  Judy Martin states that she used to smoke heavily when she was younger but quit in her 10s.  Approximately 1 or 2 years ago, she picked back up with smoking but smokes 3 to 4 cigars daily.  She was uncertain if she had been diagnosed with COPD in the past, but suspects that is what is happening now.  ED Course:  On arrival to the ED, patient was hypertensive at 161/87 with heart rate of 98.  She was saturating at 84% on room air with improvement to 95% on 3 L. Initial workup remarkable for WBC of 7.6, BNP of 66, D-dimer of 0.46, potassium of 2.8, creatinine 0.70 and GFR above 60.  Chest x-ray was obtained that did not show any acute cardiopulmonary abnormalities.  COVID-19, influenza and RSV PCR negative.  Due to acute hypoxic respiratory failure, [facility] contacted for admission.   Review of Systems: As mentioned in the history of present illness. All other systems reviewed and are negative.  Past Medical History:  Diagnosis Date   Hypercholesteremia    Hypertension    Past Surgical History:  Procedure Laterality Date   ABDOMINAL HYSTERECTOMY     Social History:  reports that she has been  smoking. She does not have any smokeless tobacco history on file. She reports current alcohol use. No history on file for drug use.  No Known Allergies  History reviewed. No pertinent family history.  Prior to Admission medications   Medication Sig Start Date End Date Taking? Authorizing Provider  albuterol (VENTOLIN HFA) 108 (90 Base) MCG/ACT inhaler Inhale 2 puffs into the lungs every 6 (six) hours as needed for wheezing or shortness of breath. 07/04/22   Nance Pear, MD  lisinopril (PRINIVIL,ZESTRIL) 20 MG tablet Take 20 mg by mouth daily.    [provider]  methocarbamol (ROBAXIN) 500 MG tablet Take 1 tablet (500 mg total) by mouth at bedtime as needed. 08/23/19   Johnn Hai, PA-C  naproxen (NAPROSYN) 500 MG tablet Take 1 tablet (500 mg total) by mouth 2 (two) times daily with a meal. 08/23/19   Johnn Hai, PA-C  predniSONE (STERAPRED UNI-PAK 21 TAB) 10 MG (21) TBPK tablet Per packaging instructions 07/04/22   Nance Pear, MD  simvastatin (ZOCOR) 40 MG tablet Take 40 mg by mouth daily.    [provider]    Physical Exam: Vitals:   01/26/23 1530 01/26/23 1600 01/26/23 1630 01/26/23 1655  BP: (!) 157/83 (!) 154/92 (!) 165/81   Pulse: 86 85 88   Resp: 18     Temp:      SpO2: 94% 94% 94%   Weight:  71.7 kg  Height:    5\' 5"  (1.651 m)   Physical Exam Vitals and nursing note reviewed.  Constitutional:      General: She is not in acute distress.    Appearance: She is normal weight. She is not toxic-appearing.  HENT:     Head: Normocephalic and atraumatic.     Mouth/Throat:     Mouth: Mucous membranes are moist.     Pharynx: Oropharynx is clear.  Eyes:     Extraocular Movements: Extraocular movements intact.     Pupils: Pupils are equal, round, and reactive to light.  Cardiovascular:     Rate and Rhythm: Normal rate and regular rhythm.     Heart sounds: No murmur heard. Pulmonary:     Effort: Pulmonary effort is normal. No tachypnea or  accessory muscle usage.     Breath sounds: No decreased breath sounds, wheezing, rhonchi or rales.  Abdominal:     General: Bowel sounds are normal.     Palpations: Abdomen is soft.  Musculoskeletal:     Cervical back: Neck supple.     Right lower leg: No edema.     Left lower leg: No edema.  Skin:    General: Skin is warm and dry.  Neurological:     General: No focal deficit present.     Mental Status: She is alert and oriented to person, place, and time.  Psychiatric:        Mood and Affect: Mood normal.        Behavior: Behavior normal.    Data Reviewed: CBC with WBC of 7.6, hemoglobin of 15.3, MCV of 100 and platelets of 329 BMP with potassium of 2.8, sodium of 141, chloride 104, bicarb 31, glucose 105, BUN 9, creatinine 0.70 with GFR above 60 BNP within normal limits at 66 D-dimer within normal limits at 0.46 COVID-19, influenza and RSV PCR negative  Chest x-ray personally reviewed.  No evidence of opacities noted, however diaphragmatic flattening noted.  No pleural effusions.  EKG personally reviewed.  Sinus rhythm with rate of 96.  PVC and PAC noted.  No ST or T wave changes concerning for acute ischemia.  There are no new results to review at this time.  Assessment and Plan:  * Acute hypoxic respiratory failure (HCC) Likely in the setting of COPD exacerbation with evidence of diaphragmatic flattening on chest x-ray, however interestingly, she does not have any wheezing on examination today.  Per chart review, she had an ED visit several months ago at which time she was noted to be wheezing extensively though.  She will need formal PFTs 4 to 6 weeks after resolution of acute issues.  For further evaluation of other etiology, such as ILD, will order CT chest.  - Continue supplemental oxygen to maintain oxygen saturation above 88% - Wean as tolerated - CT chest without contrast - S/p Solu-Medrol 125 mg once - Start prednisone 40 mg tomorrow to complete a 5-day course -  RVP pending - DuoNebs every 6 hours - Start maintenance bronchodilators tomorrow  Hypokalemia Acute on chronic.  Potentially in the setting of lisinopril.  No EKG changes.  - S/p potassium chloride 40 mEq - Repeat BMP in the a.m.  Polycythemia Hemoglobin elevated on CBC, and compared to prior CBC, appears chronic.  Likely in the setting of tobacco use disorder and COPD.  Essential hypertension Blood pressure above goal on admission.  Potentially will need further titration of her antihypertensives, but will continue home doses for now.  -  Continue home antihypertensives  Advance Care Planning:   Code Status: Full Code   Consults: None  Family Communication: No family at bedside  Severity of Illness: The appropriate patient status for this patient is OBSERVATION. Observation status is judged to be reasonable and necessary in order to provide the required intensity of service to ensure the patient's safety. The patient's presenting symptoms, physical exam findings, and initial radiographic and laboratory data in the context of their medical condition is felt to place them at decreased risk for further clinical deterioration. Furthermore, it is anticipated that the patient will be medically stable for discharge from the hospital within 2 midnights of admission.   Author: Jose Persia, MD 01/26/2023 5:00 PM  For on call review [email or web address].

## 2023-01-26 NOTE — Assessment & Plan Note (Signed)
Hemoglobin elevated on CBC, and compared to prior CBC, appears chronic.  Likely in the setting of tobacco use disorder and COPD.

## 2023-01-26 NOTE — Assessment & Plan Note (Addendum)
Acute on chronic.  Potentially in the setting of lisinopril.  No EKG changes.  - S/p potassium chloride 40 mEq - Repeat BMP in the a.m.

## 2023-01-26 NOTE — ED Notes (Signed)
Pulse ox nose clamp was applied on pt's left ear at this time for accurate reading. Pt is maintaining at 97% on 3L [place] at this time.

## 2023-01-26 NOTE — ED Notes (Signed)
Patient transported to CT 

## 2023-01-26 NOTE — ED Notes (Addendum)
Pt placed on 2L [place] at this time and O2 improved to 91%

## 2023-01-26 NOTE — ED Provider Notes (Signed)
[facility] Provider Note    Event Date/Time   First MD Initiated Contact with Patient 01/26/23 1459     (approximate)   History   Shortness of Breath   HPI  Judy Martin is a 67 y.o. female who presents to the ER for evaluation of shortness of breath is getting progressively worse over the past month.  She doe smoke more than a pack a day.  No history of COPD bronchitis no history of asthma.  No history of CHF.  Has not had any cough or chills.  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  Denies any history of DVT or PE.  She went to medical clinic today was found to be hypoxic on room air rest was sent to the ER for further evaluation.     Physical Exam   Triage Vital Signs: ED Triage Vitals  Enc Vitals Group     BP 01/26/23 1244 (!) 161/87     Pulse Rate 01/26/23 1244 98     Resp 01/26/23 1244 20     Temp 01/26/23 1244 98 F (36.7 C)     Temp src --      SpO2 01/26/23 1244 (!) 84 %     Weight --      Height --      Head Circumference --      Peak Flow --      Pain Score 01/26/23 1243 0     Pain Loc --      Pain Edu? --      Excl. in [place]? --     Most recent vital signs: Vitals:   01/26/23 1500 01/26/23 1530  BP: (!) 158/89 (!) 157/83  Pulse: 89 86  Resp: 18 18  Temp:    SpO2: 95% 94%     Constitutional: Alert  Eyes: Conjunctivae are normal.  Head: Atraumatic. Nose: No congestion/rhinnorhea. Mouth/Throat: Mucous membranes are moist.   Neck: Painless ROM.  Cardiovascular:   Good peripheral circulation. Respiratory: Normal respiratory effort.  No retractions.  Gastrointestinal: Soft and nontender.  Musculoskeletal:  no deformity Neurologic:  MAE spontaneously. No gross focal neurologic deficits are appreciated.  Skin:  Skin is warm, dry and intact. No rash noted. Psychiatric: Mood and affect are normal. Speech and behavior are normal.    ED Results / Procedures / Treatments   Labs (all labs  ordered are listed, but only abnormal results are displayed) Labs Reviewed  BASIC METABOLIC PANEL - Abnormal; Notable for the following components:      Result Value   Potassium 2.8 (*)    Glucose, Bld 105 (*)    All other components within normal limits  CBC - Abnormal; Notable for the following components:   Hemoglobin 15.3 (*)    All other components within normal limits  RESP PANEL BY RT-PCR (RSV, FLU A&B, COVID)  [id number]  D-DIMER, QUANTITATIVE  BRAIN NATRIURETIC PEPTIDE     EKG  ED ECG REPORT I, Merlyn Lot, the attending physician, personally viewed and interpreted this ECG.   Date: 01/26/2023  EKG Time: 12:54  Rate: 95  Rhythm: sinus  Axis: normal  Intervals: borderline prolonged qt  ST&T Change: no stemi, no depressions    RADIOLOGY Please see ED Course for my review and interpretation.  I personally reviewed all radiographic images ordered to evaluate for the above acute complaints and reviewed radiology reports and findings.  These findings were personally discussed with the patient.  Please see medical record for radiology report.    PROCEDURES:  Critical Care performed: Yes, see critical care procedure note(s)  .Critical Care  Performed by: Merlyn Lot, MD Authorized by: Merlyn Lot, MD   Critical care provider statement:    Critical care time (minutes):  35   Critical care was necessary to treat or prevent imminent or life-threatening deterioration of the following conditions:  Respiratory failure   Critical care was time spent personally by me on the following activities:  Ordering and performing treatments and interventions, ordering and review of laboratory studies, ordering and review of radiographic studies, pulse oximetry, re-evaluation of patient's condition, review of old charts, obtaining history from patient or surrogate, examination of patient, evaluation of patient's response to treatment, discussions with primary provider,  discussions with consultants and development of treatment plan with patient or surrogate    MEDICATIONS ORDERED IN ED: Medications  ipratropium-albuterol (DUONEB) 0.5-2.5 (3) MG/3ML nebulizer solution 3 mL (has no administration in time range)  potassium chloride SA (KLOR-CON M) CR tablet 40 mEq (has no administration in time range)  ipratropium-albuterol (DUONEB) 0.5-2.5 (3) MG/3ML nebulizer solution 3 mL (3 mLs Nebulization Given 01/26/23 1513)  methylPREDNISolone sodium succinate (SOLU-MEDROL) 125 mg/2 mL injection 125 mg (125 mg Intravenous Given 01/26/23 1536)     IMPRESSION / MDM / [place] / ED COURSE  I reviewed the triage vital signs and the nursing notes.                              Differential diagnosis includes, but is not limited to, Asthma, copd, CHF, pna, ptx, malignancy, Pe, anemia  Patient presenting to the ER for evaluation of symptoms as described above.  Based on symptoms, risk factors and considered above differential, this presenting complaint could reflect a potentially life-threatening illness therefore the patient will be placed on continuous pulse oximetry and telemetry for monitoring.  Laboratory evaluation will be sent to evaluate for the above complaints.  Was found to be short of breath with acute respiratory failure with hypoxia requiring supplemental oxygen.  Suspect this is COPD as her chest x-ray on my review and interpretation does not show any evidence of consolidation or infiltrate no effusion.  Does not have any lower extremity swelling.  I do not appreciate much significant wheezing but given nebulizer.  She is lowers by Wells criteria, D-dimer is negative.  Will send viral panel to further evaluate anticipate she is going to require hospitalization.   Clinical Course as of 01/26/23 1624  Wed Jan 26, 2023  1618 Patient reassessed.  She is still requiring supplemental oxygen does appear to slightly improved air movement after nebulizer  treatment will order additional nebs.  Given her degree of hypoxia not on home oxygen will consult hospitalist for admission.  Will give supplemental potassium for her hypokalemia. [PR]    Clinical Course User Index [PR] Merlyn Lot, MD    FINAL CLINICAL IMPRESSION(S) / ED DIAGNOSES   Final diagnoses:  Acute respiratory failure with hypoxia ([place])  COPD exacerbation ([place])  Hypokalemia     Rx / DC Orders   ED Discharge Orders     None        Note:  This document was prepared using Dragon voice recognition software and may include unintentional dictation errors.    Merlyn Lot, MD 01/26/23 [phone]

## 2023-01-26 NOTE — ED Triage Notes (Signed)
Pt comes with c/o increased sob for about a month. Pt states it is worse when she is moving around. Pt states she was also told her O2 was low. Pt denies any copd or chf. Pt does smoke.

## 2023-01-27 ENCOUNTER — Observation Stay: Payer: Medicare Other

## 2023-01-27 DIAGNOSIS — J9601 Acute respiratory failure with hypoxia: Secondary | ICD-10-CM | POA: Diagnosis not present

## 2023-01-27 DIAGNOSIS — J479 Bronchiectasis, uncomplicated: Secondary | ICD-10-CM

## 2023-01-27 LAB — HIV ANTIBODY (ROUTINE TESTING W REFLEX): HIV Screen 4th Generation wRfx: NONREACTIVE

## 2023-01-27 LAB — CBC WITH DIFFERENTIAL/PLATELET
Abs Immature Granulocytes: 0.05 10*3/uL (ref 0.00–0.07)
Basophils Absolute: 0 10*3/uL (ref 0.0–0.1)
Basophils Relative: 0 %
Eosinophils Absolute: 0 10*3/uL (ref 0.0–0.5)
Eosinophils Relative: 0 %
HCT: 42.3 % (ref 36.0–46.0)
Hemoglobin: 14.5 g/dL (ref 12.0–15.0)
Immature Granulocytes: 1 %
Lymphocytes Relative: 9 %
Lymphs Abs: 0.8 10*3/uL (ref 0.7–4.0)
MCH: 34.2 pg — ABNORMAL HIGH (ref 26.0–34.0)
MCHC: 34.3 g/dL (ref 30.0–36.0)
MCV: 99.8 fL (ref 80.0–100.0)
Monocytes Absolute: 0.1 10*3/uL (ref 0.1–1.0)
Monocytes Relative: 1 %
Neutro Abs: 8.1 10*3/uL — ABNORMAL HIGH (ref 1.7–7.7)
Neutrophils Relative %: 89 %
Platelets: 322 10*3/uL (ref 150–400)
RBC: 4.24 MIL/uL (ref 3.87–5.11)
RDW: 13.7 % (ref 11.5–15.5)
WBC: 9.1 10*3/uL (ref 4.0–10.5)
nRBC: 0 % (ref 0.0–0.2)

## 2023-01-27 LAB — ECHOCARDIOGRAM COMPLETE
Area-P 1/2: 4.35 cm2
Height: 65 in
S' Lateral: 2.4 cm
Weight: 2528 oz

## 2023-01-27 LAB — RESPIRATORY PANEL BY PCR

## 2023-01-27 LAB — BASIC METABOLIC PANEL
Anion gap: 12 (ref 5–15)
BUN: 20 mg/dL (ref 8–23)
CO2: 24 mmol/L (ref 22–32)
Calcium: 9.1 mg/dL (ref 8.9–10.3)
Chloride: 104 mmol/L (ref 98–111)
Creatinine, Ser: 0.81 mg/dL (ref 0.44–1.00)
GFR, Estimated: >60 mL/min (ref >60)
Glucose, Bld: 177 mg/dL — ABNORMAL HIGH (ref 70–99)
Potassium: 3.6 mmol/L (ref 3.5–5.1)
Sodium: 140 mmol/L (ref 135–145)

## 2023-01-27 LAB — HEMOGLOBIN A1C
Hgb A1c MFr Bld: 6 % — ABNORMAL HIGH (ref 4.8–5.6)
Mean Plasma Glucose: 125.5 mg/dL

## 2023-01-27 LAB — PROCALCITONIN: Procalcitonin: <0.1 ng/mL

## 2023-01-27 MED ORDER — IOHEXOL 350 MG/ML SOLN
75.0000 mL | Freq: Once | INTRAVENOUS | Status: AC | PRN
  Administered 2023-01-27: 75 mL via INTRAVENOUS

## 2023-01-27 MED ORDER — LEVOFLOXACIN 750 MG PO TABS: 750.0000 mg | ORAL_TABLET | Freq: Every day | ORAL | 0 refills | Status: AC

## 2023-01-27 MED ORDER — PREDNISONE 20 MG PO TABS: 40.0000 mg | ORAL_TABLET | Freq: Every day | ORAL | 0 refills | Status: DC

## 2023-01-27 NOTE — Plan of Care (Signed)
  Problem: Education: Goal: Knowledge of General Education information will improve Description: Including pain rating scale, medication(s)/side effects and non-pharmacologic comfort measures Outcome: Progressing   Problem: Health Behavior/Discharge Planning: Goal: Ability to manage health-related needs will improve Outcome: Progressing   Problem: Clinical Measurements: Goal: Ability to maintain clinical measurements within normal limits will improve Outcome: Progressing Goal: Diagnostic test results will improve Outcome: Progressing Goal: Respiratory complications will improve Outcome: Progressing Goal: Cardiovascular complication will be avoided Outcome: Progressing   Problem: Activity: Goal: Risk for activity intolerance will decrease Outcome: Progressing   Problem: Nutrition: Goal: Adequate nutrition will be maintained Outcome: Progressing   Problem: Elimination: Goal: Will not experience complications related to bowel motility Outcome: Progressing Goal: Will not experience complications related to urinary retention Outcome: Progressing

## 2023-01-27 NOTE — Plan of Care (Signed)
  Problem: Activity: ?Goal: Risk for activity intolerance will decrease ?Outcome: Progressing ?  ?Problem: Pain Managment: ?Goal: General experience of comfort will improve ?Outcome: Progressing ?  ?Problem: Safety: ?Goal: Ability to remain free from injury will improve ?Outcome: Progressing ?  ?Problem: Skin Integrity: ?Goal: Risk for impaired skin integrity will decrease ?Outcome: Progressing ?  ?

## 2023-01-27 NOTE — Progress Notes (Signed)
DISCHARGE NOTE:  Pt given discharge instructions and verbalized understanding. Pt wheeled to car by volunteer.

## 2023-01-27 NOTE — Discharge Summary (Signed)
Judy Martin [id number] DOB: 06/15/1956 DOA: 01/26/2023  PCP: [facility] date: 01/26/2023 Discharge date: 01/27/2023  Time spent: 35 minutes  Recommendations for Outpatient Follow-up:  Pcp f/u  2. Pulmonology f/u   Discharge Diagnoses:  Principal Problem:   Acute hypoxic respiratory failure ([place]) Active Problems:   Hypokalemia   Polycythemia   Essential hypertension   Bronchiectasis ([place])   Discharge Condition: stable  Diet recommendation: heart healthy  Filed Weights   01/26/23 1655  Weight: 71.7 kg    History of present illness:  From admission h and p Judy Martin is a 67 y.o. female with medical history significant of HTN and HLD who presents to the ED with c/o SOB.    Judy Martin states that for the last 1 month, she has been experiencing gradually worsening shortness of breath both at rest and with exertion.  She denies any cough, rhinorrhea, congestion, or chest pain.  She denies any lower extremity swelling or orthopnea.  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   Judy Martin states that she used to smoke heavily when she was younger but quit in her 63s.  Approximately 1 or 2 years ago, she picked back up with smoking but smokes 3 to 4 cigars daily.  She was uncertain if she had been diagnosed with COPD in the past, but suspects that is what is happening now.  Hospital Course:  Patient with a history of htn, dm, smoking, who presents with concern for rsv. She was recently exposed. She also reports several month history of gradually worsening shortness of breath, mainly with ambulation. On exam found to be hypoxic to the mid-80s, treated with o2. Denies sputum production. CT of chest shows possible bronchiectectatic changes. Also with signs of pulmonary hypertension so TTE and CTA of chest were obtained. No PE identified, no significant findings on  TTE. No focal consolidation to suggest pneumonia. Covid/flu/rsv negative and so was viral respiratory panel. Patient not producing sputum for culture. Weaned off oxygen at rest but did drop to mid-80s with ambulation. She tolerated ambulation well and declines home oxygen. Will treat for possible bronchiectasis with exacerbation with course oral steroids and levofloxacin. Advised smoking cessation and referred to pulmonology.   Procedures: none   Consultations: none  Discharge Exam: Vitals:   01/27/23 1317 01/27/23 1330  BP:    Pulse:    Resp:    Temp:    SpO2: 96% 97%    General: NAD Cardiovascular: RRR no murmur Respiratory: faint rales at bases  Discharge Instructions   Discharge Instructions     Ambulatory referral to Pulmonology   Complete by: As directed    Hypoxia, bronchiectasis   Reason for referral: Other   Diet - low sodium heart healthy   Complete by: As directed    Increase activity slowly   Complete by: As directed       Allergies as of 01/27/2023   No Known Allergies      Medication List     TAKE these medications    albuterol 108 (90 Base) MCG/ACT inhaler Commonly known as: VENTOLIN HFA Inhale 2 puffs into the lungs every 6 (six) hours as needed for wheezing or shortness of breath.   amLODipine 10 MG tablet Commonly known as: NORVASC Take 10 mg by mouth at bedtime.   aspirin EC 81 MG tablet Take 81 mg  by mouth daily.   atenolol 100 MG tablet Commonly known as: TENORMIN Take 100 mg by mouth daily.   atorvastatin 80 MG tablet Commonly known as: LIPITOR Take 80 mg by mouth at bedtime.   glimepiride 1 MG tablet Commonly known as: AMARYL Take 1 mg by mouth at bedtime.   levofloxacin 750 MG tablet Commonly known as: Levaquin Take 1 tablet (750 mg total) by mouth daily for 5 days.   potassium chloride 10 MEQ tablet Commonly known as: KLOR-CON M Take 10 mEq by mouth daily.   predniSONE 20 MG tablet Commonly known as:  DELTASONE Take 2 tablets (40 mg total) by mouth daily with breakfast. Start taking on: January 28, 2023   valsartan 320 MG tablet Commonly known as: DIOVAN Take 320 mg by mouth daily.   Vitamin D (Ergocalciferol) 1.25 MG (50000 UNIT) Caps capsule Commonly known as: DRISDOL Take 50,000 Units by mouth every 7 (seven) days.       No Known Allergies  Follow-up Information     Vida Rigger, MD. Schedule an appointment as soon as possible for a visit.   Specialty: Pulmonary Disease Contact information: [address] [place] [address] [phone]         [facility] Follow up.   Contact information: [address] [place] [address] [phone]                  The results of significant diagnostics from this hospitalization (including imaging, microbiology, ancillary and laboratory) are listed below for reference.    Significant Diagnostic Studies: ECHOCARDIOGRAM COMPLETE  Result Date: 01/27/2023    ECHOCARDIOGRAM REPORT   Patient Name:   Judy Martin Date of Exam: 01/26/2023 Medical Rec #:  [id number]      Height:       65.0 in Accession #:    [id number]     Weight:       158.0 lb Date of Birth:  1956-04-01      BSA:          1.790 m Patient Age:    66 years       BP:           157/83 mmHg Patient Gender: F              HR:           95 bpm. Exam Location:  [facility] Procedure: 2D Echo, Cardiac Doppler and Color Doppler Indications:     I27.2 Pulmonary hypertension  History:         Patient has no prior history of Echocardiogram examinations.                  Risk Factors:Hypertension and Dyslipidemia.  Sonographer:     Daphine Deutscher RDCS Referring Phys:  [id number] Verdene Lennert Diagnosing Phys: Julien Nordmann MD IMPRESSIONS  1. Left ventricular ejection fraction, by estimation, is 60 to 65%. The left ventricle has normal function. The left ventricle has no regional wall motion abnormalities. There is moderate left ventricular  hypertrophy. Left ventricular diastolic parameters are consistent with Grade I diastolic dysfunction (impaired relaxation).  2. Right ventricular systolic function is normal. The right ventricular size is normal. Tricuspid regurgitation signal is inadequate for assessing PA pressure.  3. Left atrial size was mildly dilated.  4. The mitral valve is normal in structure. Mild mitral valve regurgitation. No evidence of mitral stenosis.  5. The aortic valve is normal in structure. Aortic valve regurgitation  is not visualized. Aortic valve sclerosis is present, with no evidence of aortic valve stenosis.  6. The inferior vena cava is normal in size with greater than 50% respiratory variability, suggesting right atrial pressure of 3 mmHg. FINDINGS  Left Ventricle: Left ventricular ejection fraction, by estimation, is 60 to 65%. The left ventricle has normal function. The left ventricle has no regional wall motion abnormalities. The left ventricular internal cavity size was normal in size. There is  moderate left ventricular hypertrophy. Left ventricular diastolic parameters are consistent with Grade I diastolic dysfunction (impaired relaxation). Right Ventricle: The right ventricular size is normal. No increase in right ventricular wall thickness. Right ventricular systolic function is normal. Tricuspid regurgitation signal is inadequate for assessing PA pressure. Left Atrium: Left atrial size was mildly dilated. Right Atrium: Right atrial size was normal in size. Pericardium: There is no evidence of pericardial effusion. Mitral Valve: The mitral valve is normal in structure. Mild mitral valve regurgitation. No evidence of mitral valve stenosis. Tricuspid Valve: The tricuspid valve is normal in structure. Tricuspid valve regurgitation is mild . No evidence of tricuspid stenosis. Aortic Valve: The aortic valve is normal in structure. Aortic valve regurgitation is not visualized. Aortic valve sclerosis is present, with no  evidence of aortic valve stenosis. Pulmonic Valve: The pulmonic valve was normal in structure. Pulmonic valve regurgitation is not visualized. No evidence of pulmonic stenosis. Aorta: The aortic root is normal in size and structure. Venous: The inferior vena cava is normal in size with greater than 50% respiratory variability, suggesting right atrial pressure of 3 mmHg. IAS/Shunts: No atrial level shunt detected by color flow Doppler.  LEFT VENTRICLE PLAX 2D LVIDd:         3.90 cm   Diastology LVIDs:         2.40 cm   LV e' medial:    5.11 cm/s LV PW:         1.50 cm   LV E/e' medial:  11.4 LV IVS:        1.30 cm   LV e' lateral:   5.55 cm/s LVOT diam:     2.00 cm   LV E/e' lateral: 10.5 LV SV:         86 LV SV Index:   48 LVOT Area:     3.14 cm  RIGHT VENTRICLE RV Basal diam:  3.60 cm RV S prime:     19.37 cm/s TAPSE (M-mode): 1.9 cm LEFT ATRIUM             Index        RIGHT ATRIUM           Index LA diam:        5.00 cm 2.79 cm/m   RA Area:     13.40 cm LA Vol (A2C):   56.6 ml 31.63 ml/m  RA Volume:   29.90 ml  16.71 ml/m LA Vol (A4C):   41.3 ml 23.08 ml/m LA Biplane Vol: 50.3 ml 28.11 ml/m  AORTIC VALVE LVOT Vmax:   165.00 cm/s LVOT Vmean:  111.500 cm/s LVOT VTI:    0.272 m  AORTA Ao Root diam: 2.90 cm Ao Asc diam:  3.10 cm MITRAL VALVE MV Area (PHT): 4.35 cm    SHUNTS MV Decel Time: 175 msec    Systemic VTI:  0.27 m MV E velocity: 58.15 cm/s  Systemic Diam: 2.00 cm MV A velocity: 89.85 cm/s MV E/A ratio:  0.65 Ida Rogue MD Electronically signed by Ida Rogue  MD Signature Date/Time: 01/27/2023/12:28:56 PM    Final    CT Angio Chest Pulmonary Embolism (PE) W or WO Contrast  Result Date: 01/27/2023 CLINICAL DATA:  dyspnea, phtn EXAM: CT ANGIOGRAPHY CHEST WITH CONTRAST TECHNIQUE: Multidetector CT imaging of the chest was performed using the standard protocol during bolus administration of intravenous contrast. Multiplanar CT image reconstructions and MIPs were obtained to evaluate the vascular  anatomy. RADIATION DOSE REDUCTION: This exam was performed according to the departmental dose-optimization program which includes automated exposure control, adjustment of the mA and/or kV according to patient size and/or use of iterative reconstruction technique. CONTRAST:  31mL OMNIPAQUE IOHEXOL 350 MG/ML SOLN COMPARISON:  January 26, 2023 FINDINGS: Cardiovascular: Satisfactory opacification of the pulmonary arteries to the segmental level. No evidence of pulmonary embolism. Enlarged heart size. No pericardial effusion. Atherosclerotic calcifications of the nonaneurysmal thoracic aorta. There are predominantly LEFT-sided coronary artery atherosclerotic calcifications. Mild enlargement of the main pulmonary artery in relation to the ascending thoracic aorta. Mediastinum/Nodes: Visualized thyroid is unremarkable. No axillary or mediastinal adenopathy Lungs/Pleura: No pleural effusion or pneumothorax. Mild centrilobular emphysema. Bibasilar enhancing consolidative opacities, likely atelectasis. Scattered calcified pulmonary nodules. Upper Abdomen: Exophytic 2.6 cm mass of the superior pole of the RIGHT kidney measures above simple fluid density and is indeterminate. Indeterminate 2.4 cm LEFT adrenal nodule with Hounsfield unit of 31. Musculoskeletal: No acute osseous abnormality. Review of the MIP images confirms the above findings. IMPRESSION: 1. No acute pulmonary embolism. 2. Cardiomegaly. Enlargement of the main pulmonary artery could reflect underlying pulmonary arterial hypertension. 3. Indeterminate 2.4 cm LEFT adrenal nodule. This could be further evaluated with dedicated nonemergent adrenal protocol CT or MRI. 4. Exophytic 2.6 cm mass of the superior pole of the RIGHT kidney may measures above simple fluid density and is indeterminate. This could be assessed on the above recommended non emergent adrenal protocol CT or MR. 5. Bibasilar enhancing consolidative opacities, likely atelectasis. Aortic  Atherosclerosis (ICD10-I70.0) and Emphysema (ICD10-J43.9). Electronically Signed   By: Valentino Saxon M.D.   On: 01/27/2023 12:03   CT CHEST WO CONTRAST  Result Date: 01/26/2023 CLINICAL DATA:  Respiratory illness with increasing shortness of breath and hypoxia. EXAM: CT CHEST WITHOUT CONTRAST TECHNIQUE: Multidetector CT imaging of the chest was performed following the standard protocol without IV contrast. RADIATION DOSE REDUCTION: This exam was performed according to the departmental dose-optimization program which includes automated exposure control, adjustment of the mA and/or kV according to patient size and/or use of iterative reconstruction technique. COMPARISON:  Chest x-ray earlier today. FINDINGS: Cardiovascular: Atherosclerosis of the thoracic aorta without evidence of aneurysm. Top-normal/mildly enlarged heart. Calcified coronary artery plaque present. No pericardial fluid. Dilated central pulmonary arteries with the main pulmonary artery measuring approximately 3.2 cm. Mediastinum/Nodes: No enlarged mediastinal or axillary lymph nodes. Thyroid gland, trachea, and esophagus demonstrate no significant findings. Lungs/Pleura: There is no evidence of pulmonary edema, consolidation, pneumothorax, nodule or pleural fluid. There is suggestion of potentially the mild bronchiectasis in the inferior lower lobes bilaterally. There may also be some mild bronchiectasis in the medial right middle lobe with associated focal area of mucous plugging. Upper Abdomen: No acute abnormality. Musculoskeletal: No chest wall mass or suspicious bone lesions identified. IMPRESSION: 1. Dilated central pulmonary arteries consistent with underlying pulmonary hypertension. 2. Coronary atherosclerosis with calcified coronary artery plaque. 3. Mild bronchiectasis in the inferior lower lobes bilaterally and medial right middle lobe. Associated focal area of mucous plugging in the right middle lobe. 4. Top-normal/mildly enlarged  heart. 5. Aortic atherosclerosis  without evidence of aneurysm. Aortic Atherosclerosis (ICD10-I70.0). Electronically Signed   By: Aletta Edouard M.D.   On: 01/26/2023 17:20   DG Chest 2 View  Result Date: 01/26/2023 CLINICAL DATA:  Shortness of breath. EXAM: CHEST - 2 VIEW COMPARISON:  07/04/2022. FINDINGS: Trachea is midline. Heart size stable. Thoracic aorta is calcified. Probable mild streaky scarring in the lung bases. No airspace consolidation or pleural fluid. IMPRESSION: No acute findings. Electronically Signed   By: Lorin Picket M.D.   On: 01/26/2023 13:54    Microbiology: Recent Results (from the past 240 hour(s))  Resp panel by RT-PCR (RSV, Flu A&B, Covid) Anterior Nasal Swab     Status: None   Collection Time: 01/26/23  3:16 PM   Specimen: Anterior Nasal Swab  Result Value Ref Range Status   SARS Coronavirus 2 by RT PCR NEGATIVE NEGATIVE Final    Comment: (NOTE) SARS-CoV-2 target nucleic acids are NOT DETECTED.  The SARS-CoV-2 RNA is generally detectable in upper respiratory specimens during the acute phase of infection. The lowest concentration of SARS-CoV-2 viral copies this assay can detect is 138 copies/mL. A negative result does not preclude SARS-Cov-2 infection and should not be used as the sole basis for treatment or other patient management decisions. A negative result may occur with  improper specimen collection/handling, submission of specimen other than nasopharyngeal swab, presence of viral mutation(s) within the areas targeted by this assay, and inadequate number of viral copies(<138 copies/mL). A negative result must be combined with clinical observations, patient history, and epidemiological information. The expected result is Negative.  Fact Sheet for Patients:  EntrepreneurPulse.com.au  Fact Sheet for Healthcare Providers:  IncredibleEmployment.be  This test is no t yet approved or cleared by the [place] FDA  and  has been authorized for detection and/or diagnosis of SARS-CoV-2 by FDA under an Emergency Use Authorization (EUA). This EUA will remain  in effect (meaning this test can be used) for the duration of the COVID-19 declaration under Section 564(b)(1) of the Act, 21 U.S.C.section 360bbb-3(b)(1), unless the authorization is terminated  or revoked sooner.       Influenza A by PCR NEGATIVE NEGATIVE Final   Influenza B by PCR NEGATIVE NEGATIVE Final    Comment: (NOTE) The Xpert Xpress SARS-CoV-2/FLU/RSV plus assay is intended as an aid in the diagnosis of influenza from Nasopharyngeal swab specimens and should not be used as a sole basis for treatment. Nasal washings and aspirates are unacceptable for Xpert Xpress SARS-CoV-2/FLU/RSV testing.  Fact Sheet for Patients: EntrepreneurPulse.com.au  Fact Sheet for Healthcare Providers: IncredibleEmployment.be  This test is not yet approved or cleared by the [place] FDA and has been authorized for detection and/or diagnosis of SARS-CoV-2 by FDA under an Emergency Use Authorization (EUA). This EUA will remain in effect (meaning this test can be used) for the duration of the COVID-19 declaration under Section 564(b)(1) of the Act, 21 U.S.C. section 360bbb-3(b)(1), unless the authorization is terminated or revoked.     Resp Syncytial Virus by PCR NEGATIVE NEGATIVE Final    Comment: (NOTE) Fact Sheet for Patients: EntrepreneurPulse.com.au  Fact Sheet for Healthcare Providers: IncredibleEmployment.be  This test is not yet approved or cleared by the [place] FDA and has been authorized for detection and/or diagnosis of SARS-CoV-2 by FDA under an Emergency Use Authorization (EUA). This EUA will remain in effect (meaning this test can be used) for the duration of the COVID-19 declaration under Section 564(b)(1) of the Act, 21 U.S.C. section 360bbb-3(b)(1),  unless the  authorization is terminated or revoked.  Performed at [facility], [place], [place] [address]   Respiratory (~20 pathogens) panel by PCR     Status: None   Collection Time: 01/26/23  3:16 PM   Specimen: Nasopharyngeal Swab; Respiratory  Result Value Ref Range Status   Adenovirus NOT DETECTED NOT DETECTED Final   Coronavirus 229E NOT DETECTED NOT DETECTED Final    Comment: (NOTE) The Coronavirus on the Respiratory Panel, DOES NOT test for the novel  Coronavirus (2019 nCoV)    Coronavirus HKU1 NOT DETECTED NOT DETECTED Final   Coronavirus NL63 NOT DETECTED NOT DETECTED Final   Coronavirus OC43 NOT DETECTED NOT DETECTED Final   Metapneumovirus NOT DETECTED NOT DETECTED Final   Rhinovirus / Enterovirus NOT DETECTED NOT DETECTED Final   Influenza A NOT DETECTED NOT DETECTED Final   Influenza B NOT DETECTED NOT DETECTED Final   Parainfluenza Virus 1 NOT DETECTED NOT DETECTED Final   Parainfluenza Virus 2 NOT DETECTED NOT DETECTED Final   Parainfluenza Virus 3 NOT DETECTED NOT DETECTED Final   Parainfluenza Virus 4 NOT DETECTED NOT DETECTED Final   Respiratory Syncytial Virus NOT DETECTED NOT DETECTED Final   Bordetella pertussis NOT DETECTED NOT DETECTED Final   Bordetella Parapertussis NOT DETECTED NOT DETECTED Final   Chlamydophila pneumoniae NOT DETECTED NOT DETECTED Final   Mycoplasma pneumoniae NOT DETECTED NOT DETECTED Final    Comment: Performed at [facility], [place]. [address] [place],  [address]     Labs: Basic Metabolic Panel: Recent Labs  Lab 01/26/23 1244 01/27/23 0444  NA 141 140  K 2.8* 3.6  CL 104 104  CO2 31 24  GLUCOSE 105* 177*  BUN 9 20  CREATININE 0.70 0.81  CALCIUM 8.9 9.1   Liver Function Tests: No results for input(s): "AST", "ALT", "ALKPHOS", "BILITOT", "PROT", "ALBUMIN" in the last 168 hours. No results for input(s): "LIPASE", "AMYLASE" in the last 168 hours. No results for input(s): "AMMONIA"  in the last 168 hours. CBC: Recent Labs  Lab 01/26/23 1244 01/27/23 0444  WBC 7.6 9.1  NEUTROABS  --  8.1*  HGB 15.3* 14.5  HCT 45.3 42.3  MCV 100.0 99.8  PLT 329 322   Cardiac Enzymes: No results for input(s): "CKTOTAL", "CKMB", "CKMBINDEX", "TROPONINI" in the last 168 hours. BNP: BNP (last 3 results) Recent Labs    01/26/23 1244  BNP 66.7    ProBNP (last 3 results) No results for input(s): "PROBNP" in the last 8760 hours.  CBG: No results for input(s): "GLUCAP" in the last 168 hours.     Signed:  Desma Maxim MD.  [facility] 01/27/2023, 1:43 PM

## 2023-01-27 NOTE — Progress Notes (Signed)
  Transition of Care (TOC) Screening Note   Patient Details  Name: Alexx Giambra Date of Birth: December 08, 1956   Transition of Care [facility]) CM/SW Contact:    Quin Hoop, LCSW Phone Number: 01/27/2023, 8:59 AM    Transition of Care Department [facility]) has reviewed patient and no TOC needs have been identified at this time. We will continue to monitor patient advancement through interdisciplinary progression rounds. If new patient transition needs arise, please place a TOC consult.

## 2023-01-28 LAB — HCV AB W REFLEX TO QUANT PCR: HCV Ab: NONREACTIVE

## 2023-01-28 LAB — HCV INTERPRETATION

## 2023-02-24 ENCOUNTER — Ambulatory Visit: Payer: Medicare Other | Admitting: Student in an Organized Health Care Education/Training Program

## 2023-02-24 ENCOUNTER — Encounter: Payer: Self-pay | Admitting: Student in an Organized Health Care Education/Training Program

## 2023-02-24 VITALS — BP 136/80 | HR 72 | Temp 97.7°F | Ht 65.0 in | Wt 157.6 lb

## 2023-02-24 DIAGNOSIS — R0602 Shortness of breath: Secondary | ICD-10-CM | POA: Diagnosis not present

## 2023-02-24 MED ORDER — TRELEGY ELLIPTA 100-62.5-25 MCG/ACT IN AEPB: 1.0000 | INHALATION_SPRAY | Freq: Every day | RESPIRATORY_TRACT | 0 refills | Status: DC

## 2023-02-24 NOTE — Progress Notes (Signed)
Synopsis: Referred in for shortness of breath by Si Raider Ailene Rud, MD  Assessment & Plan:   1. Shortness of breath  She is presenting for the evaluation of shortness of breath in the setting of known history of smoking and a recent admission for hypoxic respiratory failure. CT was negative for PE with no parenchymal abnormalities. TTE was also within normal with no sign of pulmonary hypertension. Will proceed with pulmonary function testing and provide the patient with an inhaler sample (LABA/LAMA/ICS) to assess for symptomatic improvement given her smoking history.  - Pulmonary Function Test [facility] Only; Future - will provide with Trelegy one puff once daily samples for one month, will consider LABA/LAMA should she show symptomatic improvement.   Return in about 2 months (around 04/25/2023).  I spent 45 minutes caring for this patient today, including preparing to see the patient, obtaining a medical history , reviewing a separately obtained history, performing a medically appropriate examination and/or evaluation, counseling and educating the patient/family/caregiver, ordering medications, tests, or procedures, and documenting clinical information in the electronic health record  Armando Reichert, MD [facility] 02/24/2023 9:02 PM    End of visit medications:  Meds ordered this encounter  Medications   Fluticasone-Umeclidin-Vilant (TRELEGY ELLIPTA) 100-62.5-25 MCG/ACT AEPB    Sig: Inhale 1 puff into the lungs daily.    Dispense:  14 each    Refill:  0    Order Specific Question:   Lot Number?    Answer:   [id number]    Order Specific Question:   Expiration Date?    Answer:   05/27/2024    Order Specific Question:   Manufacturer?    Answer:   [employer] [[id number]]    Order Specific Question:   Quantity    Answer:   1     Current Outpatient Medications:    albuterol (VENTOLIN HFA) 108 (90 Base) MCG/ACT inhaler, Inhale 2 puffs into the lungs every 6 (six) hours as  needed for wheezing or shortness of breath., Disp: 8 g, Rfl: 2   amLODipine (NORVASC) 10 MG tablet, Take 10 mg by mouth at bedtime., Disp: , Rfl:    aspirin EC 81 MG tablet, Take 81 mg by mouth daily., Disp: , Rfl:    atenolol (TENORMIN) 100 MG tablet, Take 100 mg by mouth daily., Disp: , Rfl:    atorvastatin (LIPITOR) 80 MG tablet, Take 80 mg by mouth at bedtime., Disp: , Rfl:    Fluticasone-Umeclidin-Vilant (TRELEGY ELLIPTA) 100-62.5-25 MCG/ACT AEPB, Inhale 1 puff into the lungs daily., Disp: 14 each, Rfl: 0   glimepiride (AMARYL) 1 MG tablet, Take 1 mg by mouth at bedtime., Disp: , Rfl:    potassium chloride (KLOR-CON M) 10 MEQ tablet, Take 10 mEq by mouth daily., Disp: , Rfl:    valsartan (DIOVAN) 320 MG tablet, Take 320 mg by mouth daily., Disp: , Rfl:    Vitamin D, Ergocalciferol, (DRISDOL) 1.25 MG (50000 UNIT) CAPS capsule, Take 50,000 Units by mouth every 7 (seven) days., Disp: , Rfl:    Subjective:   PATIENT ID: Judy Martin GENDER: female DOB: [birth year], MRN: [id number]  Chief Complaint  Patient presents with   pulmonary consult    SOB with exertion.     HPI  Judy Martin is a pleasant 67 year old female presenting to clinic for the evaluation of shortness of breath.  Patient reports that she's been experiencing shortness of breath with exertion that has worsened recently. She is asymptomatic at rest, but feels dyspneic  when walking long distances (such as from the parking lot) or going up a flight of stairs. She has no cough, no sputum production, no chest pain, no hemoptysis, no chest tightness, no wheezing, no fevers, no chills, and no night sweats. Her weight has been stable. Otherwise, patient denies any other medical problems. She has not noticed the breathing to be worse at night vs during the day. Patient provided short answers to questions and was not able to elaborate more re her symptoms.  Patient was admitted to the hospital for two days 1/31 - 2/1 for acute hypoxic  respiratory failure. She had presented to the ED with shortness of breath and was exposed to RSV prior to that. Viral workup was negative, and CT/PE did not show any VTE or pneumonia. She improved with conservative management. She was discharged home with a referral to pulmonology.  Patient is a smoking and has been smoking 1/2 pack a day for a few decades.  Ancillary information including prior medications, full medical/surgical/family/social histories, and PFTs (when available) are listed below and have been reviewed.   Review of Systems  Constitutional:  Negative for chills, fever, malaise/fatigue and weight loss.  Respiratory:  Positive for shortness of breath. Negative for cough, hemoptysis, sputum production and wheezing.   Cardiovascular:  Negative for chest pain, palpitations, orthopnea, claudication and PND.  Skin:  Negative for rash.     Objective:   Vitals:   02/24/23 1435  BP: 136/80  Pulse: 72  Temp: 97.7 F (36.5 C)  SpO2: 93%  Weight: 157 lb 9.6 oz (71.5 kg)  Height: '5\' 5"'$  (1.651 m)   93% on RA  BMI Readings from Last 3 Encounters:  02/24/23 26.23 kg/m  01/26/23 26.29 kg/m  07/04/22 26.63 kg/m   Wt Readings from Last 3 Encounters:  02/24/23 157 lb 9.6 oz (71.5 kg)  01/26/23 158 lb (71.7 kg)  07/04/22 160 lb (72.6 kg)    Physical Exam Constitutional:      Appearance: Normal appearance.  HENT:     Head: Normocephalic.     Nose: Nose normal.     Mouth/Throat:     Mouth: Mucous membranes are moist.  Cardiovascular:     Rate and Rhythm: Normal rate and regular rhythm.     Pulses: Normal pulses.     Heart sounds: Normal heart sounds.  Pulmonary:     Effort: Pulmonary effort is normal.     Breath sounds: Normal breath sounds. No wheezing or rales.  Abdominal:     Palpations: Abdomen is soft.  Musculoskeletal:     Cervical back: Neck supple.     Right lower leg: No edema.     Left lower leg: No edema.  Neurological:     Mental Status: She is  alert. Mental status is at baseline.     Ancillary Information    Past Medical History:  Diagnosis Date   Hypercholesteremia    Hypertension      No family history on file.   Past Surgical History:  Procedure Laterality Date   ABDOMINAL HYSTERECTOMY      Social History   Socioeconomic History   Marital status: Divorced    Spouse name: Not on file   Number of children: Not on file   Years of education: Not on file   Highest education level: Not on file  Occupational History   Not on file  Tobacco Use   Smoking status: Every Day    Packs/day: 0.50  Years: 25.00    Total pack years: 12.50    Types: Cigarettes   Smokeless tobacco: Not on file  Substance and Sexual Activity   Alcohol use: Yes   Drug use: Not on file   Sexual activity: Not on file  Other Topics Concern   Not on file  Social History Narrative   Not on file   Social Determinants of Health   Financial Resource Strain: Not on file  Food Insecurity: No Food Insecurity (01/26/2023)   Hunger Vital Sign    Worried About Running Out of Food in the Last Year: Never true    Ran Out of Food in the Last Year: Never true  Transportation Needs: No Transportation Needs (01/26/2023)   PRAPARE - Hydrologist (Medical): No    Lack of Transportation (Non-Medical): No  Physical Activity: Not on file  Stress: Not on file  Social Connections: Not on file  Intimate Partner Violence: Not At Risk (01/26/2023)   Humiliation, Afraid, Rape, and Kick questionnaire    Fear of Current or Ex-Partner: No    Emotionally Abused: No    Physically Abused: No    Sexually Abused: No     No Known Allergies   CBC    Component Value Date/Time   WBC 9.1 01/27/2023 0444   RBC 4.24 01/27/2023 0444   HGB 14.5 01/27/2023 0444   HCT 42.3 01/27/2023 0444   PLT 322 01/27/2023 0444   MCV 99.8 01/27/2023 0444   MCH 34.2 (H) 01/27/2023 0444   MCHC 34.3 01/27/2023 0444   RDW 13.7 01/27/2023 0444    LYMPHSABS 0.8 01/27/2023 0444   MONOABS 0.1 01/27/2023 0444   EOSABS 0.0 01/27/2023 0444   BASOSABS 0.0 01/27/2023 0444    Pulmonary Functions Testing Results:     No data to display          Outpatient Medications Prior to Visit  Medication Sig Dispense Refill   albuterol (VENTOLIN HFA) 108 (90 Base) MCG/ACT inhaler Inhale 2 puffs into the lungs every 6 (six) hours as needed for wheezing or shortness of breath. 8 g 2   amLODipine (NORVASC) 10 MG tablet Take 10 mg by mouth at bedtime.     aspirin EC 81 MG tablet Take 81 mg by mouth daily.     atenolol (TENORMIN) 100 MG tablet Take 100 mg by mouth daily.     atorvastatin (LIPITOR) 80 MG tablet Take 80 mg by mouth at bedtime.     glimepiride (AMARYL) 1 MG tablet Take 1 mg by mouth at bedtime.     potassium chloride (KLOR-CON M) 10 MEQ tablet Take 10 mEq by mouth daily.     valsartan (DIOVAN) 320 MG tablet Take 320 mg by mouth daily.     Vitamin D, Ergocalciferol, (DRISDOL) 1.25 MG (50000 UNIT) CAPS capsule Take 50,000 Units by mouth every 7 (seven) days.     predniSONE (DELTASONE) 20 MG tablet Take 2 tablets (40 mg total) by mouth daily with breakfast. 10 tablet 0   No facility-administered medications prior to visit.

## 2023-03-28 ENCOUNTER — Telehealth: Payer: Self-pay | Admitting: Student in an Organized Health Care Education/Training Program

## 2023-03-28 NOTE — Telephone Encounter (Signed)
PT calling to set up CT at the [place] call to advise @ [phone]

## 2023-03-29 NOTE — Telephone Encounter (Signed)
Per Dr. Gretta Began last note, he ordered a PFT for her.  I spoke with the patient and scheduled her PFT for 4/16 at 2:00pm.  Nothing further needed.

## 2023-03-29 NOTE — Telephone Encounter (Addendum)
There isn't a CT ordered from our office.  Will route to triage to see if order should be placed.

## 2023-04-12 ENCOUNTER — Ambulatory Visit: Payer: No Typology Code available for payment source

## 2023-04-15 ENCOUNTER — Telehealth: Payer: Self-pay | Admitting: Student in an Organized Health Care Education/Training Program

## 2023-04-15 NOTE — Telephone Encounter (Signed)
Pt called the office. Stated that she was supposed to have had a PFT at [facility] 4/16 but got dates wrong of when her appt was supposed to have been.  Pt is needing to get this rescheduled. Pt does have an upcoming appt with Dr. Aundria Rud 4/30, not sure if this will need to be rescheduled as well depending on what was said at pt's last appt.  Routing this to both [facility] triage pool as well as Synetta Fail to work on getting this handled for pt.

## 2023-04-15 NOTE — Telephone Encounter (Signed)
I have spoke with Judy Martin and her PFT has been rescheduled on 04/21/23 :00pm [facility] and she is aware

## 2023-04-21 ENCOUNTER — Ambulatory Visit: Payer: Medicare Other | Attending: Student in an Organized Health Care Education/Training Program

## 2023-04-21 DIAGNOSIS — J988 Other specified respiratory disorders: Secondary | ICD-10-CM | POA: Insufficient documentation

## 2023-04-21 DIAGNOSIS — R942 Abnormal results of pulmonary function studies: Secondary | ICD-10-CM | POA: Insufficient documentation

## 2023-04-21 DIAGNOSIS — F17210 Nicotine dependence, cigarettes, uncomplicated: Secondary | ICD-10-CM | POA: Insufficient documentation

## 2023-04-21 DIAGNOSIS — R0602 Shortness of breath: Secondary | ICD-10-CM | POA: Diagnosis not present

## 2023-04-21 LAB — PULMONARY FUNCTION TEST ARMC ONLY
DL/VA % pred: 54 %
DL/VA: 2.26 ml/min/mmHg/L
DLCO unc % pred: 47 %
DLCO unc: 9.67 ml/min/mmHg
FEF 25-75 Post: 0.68 L/sec
FEF 25-75 Pre: 0.57 L/sec
FEF2575-%Change-Post: 19 %
FEF2575-%Pred-Post: 31 %
FEF2575-%Pred-Pre: 26 %
FEV1-%Change-Post: 9 %
FEV1-%Pred-Post: 52 %
FEV1-%Pred-Pre: 48 %
FEV1-Post: 1.31 L
FEV1-Pre: 1.2 L
FEV1FVC-%Change-Post: 7 %
FEV1FVC-%Pred-Pre: 71 %
FEV6-%Change-Post: 2 %
FEV6-%Pred-Post: 71 %
FEV6-%Pred-Pre: 69 %
FEV6-Post: 2.22 L
FEV6-Pre: 2.16 L
FEV6FVC-%Change-Post: 0 %
FEV6FVC-%Pred-Post: 103 %
FEV6FVC-%Pred-Pre: 103 %
FVC-%Change-Post: 1 %
FVC-%Pred-Post: 68 %
FVC-%Pred-Pre: 67 %
FVC-Post: 2.23 L
FVC-Pre: 2.19 L
Post FEV1/FVC ratio: 59 %
Post FEV6/FVC ratio: 100 %
Pre FEV1/FVC ratio: 55 %
Pre FEV6/FVC Ratio: 99 %
RV % pred: 161 %
RV: 3.47 L
TLC % pred: 110 %
TLC: 5.76 L

## 2023-04-21 MED ORDER — ALBUTEROL SULFATE (2.5 MG/3ML) 0.083% IN NEBU
2.5000 mg | INHALATION_SOLUTION | Freq: Once | RESPIRATORY_TRACT | Status: AC
  Administered 2023-04-21: 2.5 mg via RESPIRATORY_TRACT
  Filled 2023-04-21: qty 3

## 2023-04-26 ENCOUNTER — Ambulatory Visit: Payer: Medicare Other | Admitting: Student in an Organized Health Care Education/Training Program

## 2023-05-30 ENCOUNTER — Ambulatory Visit: Payer: Medicare Other | Admitting: Student in an Organized Health Care Education/Training Program

## 2023-05-30 ENCOUNTER — Encounter: Payer: Self-pay | Admitting: Student in an Organized Health Care Education/Training Program

## 2023-05-30 VITALS — BP 130/78 | HR 71 | Temp 97.7°F | Ht 65.0 in | Wt 153.0 lb

## 2023-05-30 DIAGNOSIS — F17219 Nicotine dependence, cigarettes, with unspecified nicotine-induced disorders: Secondary | ICD-10-CM | POA: Diagnosis not present

## 2023-05-30 DIAGNOSIS — Z23 Encounter for immunization: Secondary | ICD-10-CM | POA: Diagnosis not present

## 2023-05-30 DIAGNOSIS — N2889 Other specified disorders of kidney and ureter: Secondary | ICD-10-CM | POA: Diagnosis not present

## 2023-05-30 DIAGNOSIS — J42 Unspecified chronic bronchitis: Secondary | ICD-10-CM | POA: Diagnosis not present

## 2023-05-30 MED ORDER — NICOTINE 14 MG/24HR TD PT24: 14.0000 mg | MEDICATED_PATCH | TRANSDERMAL | 0 refills | Status: AC

## 2023-05-30 MED ORDER — NICOTINE 21 MG/24HR TD PT24: 21.0000 mg | MEDICATED_PATCH | TRANSDERMAL | 0 refills | Status: AC

## 2023-05-30 MED ORDER — NICOTINE 7 MG/24HR TD PT24: 7.0000 mg | MEDICATED_PATCH | TRANSDERMAL | 0 refills | Status: AC

## 2023-05-30 MED ORDER — NICOTINE POLACRILEX 2 MG MT LOZG: 2.0000 mg | LOZENGE | OROMUCOSAL | 3 refills | Status: AC | PRN

## 2023-05-30 MED ORDER — ANORO ELLIPTA 62.5-25 MCG/ACT IN AEPB: 1.0000 | INHALATION_SPRAY | Freq: Every day | RESPIRATORY_TRACT | 12 refills | Status: DC

## 2023-05-30 NOTE — Patient Instructions (Addendum)
You will need to be uptodate on your vaccines. We will give you the pneumonia vaccine here today. I would also like you to get the RSV vaccine as well as influenza and COVID vaccines.  The [facility] Quitline: Call 1-800-QUIT-NOW (671-841-4153). The [place] Quitline is a free service for [employer]. Trained counselors are available from 8 am until 3 am, 365 days per year. Services are available in both [place] and [place].   Web Resources Free online support programs can help you track your progress and share experiences with others who are quitting. These are examples: www.becomeanex.org www.trytostop.org  www.smokefree.gov  www.https://www.vargas.com/.aspx  [facility]: offers comprehensive in-person tobacco treatment counseling at [facility] building ([address], [place] [address]).  Open to everyone. Virtual appointments available. Free parking. Call [phone] to schedule an appointment or [phone] for general information.    Tobacco Cessation Medications  Nicotine Replacement Therapy (NRT)  Nicotine is the addictive part of tobacco smoke, but not the most dangerous part. There are 7000 other toxins in cigarettes, including carbon monoxide, that cause disease. People do not generally become addicted to medication. Common problems: People don't use enough medication or stop too early. Medications are safe and effective. Overdose is very uncommon. Use medications as long as needed (3 months minimum). Some combinations work better than single medications. Long acting medications like the NRT patch and bupropion provide continuous treatment for withdrawal symptoms.  PLUS  Short acting medications like the NRT gum, lozenge, inhaler, and nasal spray help people to cope with breakthrough cravings.  ? Nicotine Patch  Place patch on hairless skin on upper body, including arms and back. Each day: discard old patch,  shower, apply new patch to a different site. Apply hydrocortisone cream to mildly red/irritated areas. Call provider if rash develops. If patch causes sleep disturbance, remove patch at bedtime and replace each morning after shower. Side effects may include: skin irritation, headache, insomnia, abnormal/vivid dreams.  ? Nicotine Gum  Chew gum slowly, park in cheek when peppery taste or tingling sensation begins (about 15-30 chews). When taste or tingling goes away, begin chewing again. Use until nicotine is gone (taste or tingle does not return, usually 30 minutes). Park in different areas of mouth. Nicotine is absorbed through the lining of the mouth. Use enough to control cravings, up to 24 pieces per day (if used alone). Avoid eating or drinking for 15 minutes before using and during use. Side effects may include: mouth/jaw soreness, hiccups, indigestion, hypersalivation.  If gum is not chewed correctly, additional side effects may include lightheadedness, nausea/vomiting, throat and mouth irritation.  ? Nicotine Lozenge  Allow to dissolve slowly in mouth (20-30 minutes). Do not chew or swallow. Nicotine release may cause a warm tingling sensation. Occasionally rotate to different areas of the mouth. Use enough to control cravings, up to 20 lozenges per day (if used alone). Avoid eating or drinking for 15 minutes before using and during use. Side effects may include: nausea, hiccups, cough, heartburn, headache, gas, insomnia.  ? Nicotine Nasal Spray Use 1 spray in each nostril (1 dose) and tilt head back for 1 minute. Do not sniff, swallow, or inhale through nose.  Use at least 8 doses (1 spray in each nostril) , up to 40 doses per day (if used alone). To reduce nasal irritation, spray on cotton swab and insert into nose. Side effects may include: nasal and/or throat irritation (hot, peppery, or burning sensation), nasal irritation, tearing, sneezing, cough,  headache.  ? Nicotine Oral  Inhaler (puffer) Inhale into the back of the throat or puff in short breaths. Do not inhale into the lungs.  Puff continuously for 20 minutes (about 80 puffs) until cartridge is empty. Change cartridge when it loses the "burning in throat" sensation (feels like air only). Open cartridges can be saved and used again within 24 hours. Use at least 6 and up to 16 cartridges per day (if used alone).  Avoid eating or drinking for 15 minutes before using and during use. Side effects may include: mouth and/or throat irritation, unpleasant taste, cough, nasal irritation, indigestion, hiccups, headache.  ? Chantix (varenicline) Days 1-3: Take one 0.5 mg white pill each morning for 3 days, one week before quit date. Days 4-7: Increase to one 0.5 mg white pill twice a day in morning and evening for 4 days.  On Day 8 (target quit date), increase to one 1 mg blue pill twice a day. Maintain this dose for a minimum of 3 months. Take with food and a full glass of water to reduce nausea. Be sure that the two doses are at least 8 hours apart, but try to take second dose early in the evening (i.e. 6 pm) to avoid sleep problems. Common side effects include: nausea, insomnia, headache, abnormal/vivid dreams. Tell your doctor if you have any history of psychiatric illness prior to starting Chantix.  STOP taking CHANTIX and contact a healthcare provider immediately if you experience agitation, hostility, depressed mood, changes in thoughts or behavior that are not typical for you, thinking about or attempting suicide, allergic or skin reactions including swelling, rash, redness, or peeling of the skin.  For patients who have heart disease: Smoking is a major risk factor for cardiovascular disease, and Chantix can help you quit smoking. Chantix may be associated with a small, increased risk of certain heart events in patients who have heart disease. If you have any new or worsening symptoms of heart disease while taking  Chantix, such as shortness of breath or trouble breathing, new or worsening chest pain, or new or worsening pain in your legs when walking, call your doctor or get emergency medical help immediately.  ? Wellbutrin / Zyban (bupropion) Take one 150 mg pill each morning for 3 days, one week before target quit date. On Day 4, increase to one 150 mg pill twice a day, morning and evening.  Maintain this dose for a minimum of 3 months. Be sure that the two doses are at least 8 hours apart, but try to take second dose early in the evening (i.e. 6 pm) to avoid sleep problems. Avoid or minimize use of alcohol when taking this medication. Common side effects include: dry mouth, headache, insomnia, nausea, weight loss.  Risk of seizure is 12/998. STOP taking BUPROPION and contact a healthcare provider immediately if you experience agitation, hostility, depressed mood, changes in thoughts or behavior that are not typical for you, thinking about or attempting suicide, allergic or skin reactions including swelling, rash, redness, or peeling of the skin.

## 2023-05-30 NOTE — Progress Notes (Signed)
Assessment & Plan:   #Chronic bronchitis, unspecified chronic bronchitis type (HCC)  She is presenting for the evaluation of shortness of breath in the setting of known history of smoking and a recent admission for hypoxic respiratory failure. CT was negative for PE with no parenchymal abnormalities. TTE was also within normal with no sign of pulmonary hypertension. Pulmonary function testing is consistent with COPD, and will initiate LAMA/LABA therapy. Today, I've also recommended that she be up to date on her vaccines, and she will get PCV-20 today. I recommended RSV, Influenza, and COVID, but the patient is hesitant.  - umeclidinium-vilanterol (ANORO ELLIPTA) 62.5-25 MCG/ACT AEPB; Inhale 1 puff into the lungs daily.  Dispense: 30 each; Refill: 12 - PCV-20 today - Ambulatory referral to lung cancer screening  #Tobacco Use Disorder  Smoking Cessation recommended, will prescribe nicotine patches and lozenges to aid in cessation  -nicotine patch and lozenge prescription sent  #Kidney Mass  Incidentally noted on chest CT during her recent hospitalization. This was not followed up, and I will place a referral to urology for further evaluation.  -amb referral to urology   Return in about 6 months (around 11/29/2023).  I spent 30 minutes caring for this patient today, including preparing to see the patient, obtaining a medical history , reviewing a separately obtained history, performing a medically appropriate examination and/or evaluation, counseling and educating the patient/family/caregiver, ordering medications, tests, or procedures, and documenting clinical information in the electronic health record. 5 minutes spent on tobacco cessation counseling.  Raechel Chute, MD [facility] 05/30/2023 11:36 AM    End of visit medications:  Meds ordered this encounter  Medications   umeclidinium-vilanterol (ANORO ELLIPTA) 62.5-25 MCG/ACT AEPB    Sig: Inhale 1 puff  into the lungs daily.    Dispense:  30 each    Refill:  12     Current Outpatient Medications:    albuterol (VENTOLIN HFA) 108 (90 Base) MCG/ACT inhaler, Inhale 2 puffs into the lungs every 6 (six) hours as needed for wheezing or shortness of breath., Disp: 8 g, Rfl: 2   amLODipine (NORVASC) 10 MG tablet, Take 10 mg by mouth at bedtime., Disp: , Rfl:    aspirin EC 81 MG tablet, Take 81 mg by mouth daily., Disp: , Rfl:    atenolol (TENORMIN) 100 MG tablet, Take 100 mg by mouth daily., Disp: , Rfl:    atorvastatin (LIPITOR) 80 MG tablet, Take 80 mg by mouth at bedtime., Disp: , Rfl:    glimepiride (AMARYL) 1 MG tablet, Take 1 mg by mouth at bedtime., Disp: , Rfl:    potassium chloride (KLOR-CON M) 10 MEQ tablet, Take 10 mEq by mouth daily., Disp: , Rfl:    valsartan (DIOVAN) 320 MG tablet, Take 320 mg by mouth daily., Disp: , Rfl:    Vitamin D, Ergocalciferol, (DRISDOL) 1.25 MG (50000 UNIT) CAPS capsule, Take 50,000 Units by mouth every 7 (seven) days., Disp: , Rfl:    umeclidinium-vilanterol (ANORO ELLIPTA) 62.5-25 MCG/ACT AEPB, Inhale 1 puff into the lungs daily., Disp: 30 each, Rfl: 12   Subjective:   PATIENT ID: Judy Martin GENDER: female DOB: [birth year], MRN: [id number]  Chief Complaint  Patient presents with   Follow-up    PFT- SOB with exertion and dry cough at times prod with clear sputum    HPI  Ms. Olano is a pleasant 67 year old female presenting to clinic for follow up.   Patient reports that she's been experiencing shortness of  breath with exertion that has worsened recently. She is asymptomatic at rest, but feels dyspneic when walking long distances (such as from the parking lot) or going up a flight of stairs. She has no cough, no sputum production, no chest pain, no hemoptysis, no chest tightness, no wheezing, no fevers, no chills, and no night sweats. Her weight has been stable. Otherwise, patient denies any other medical problems. She has not noticed the breathing  to be worse at night vs during the day.   Patient was admitted to the hospital for two days 1/31 - 2/1 for acute hypoxic respiratory failure. She had presented to the ED with shortness of breath and was exposed to RSV prior to that. Viral workup was negative, and CT/PE did not show any VTE or pneumonia. She improved with conservative management. She was discharged home with a referral to pulmonology.  In the interim, symptoms have been unchanged. Felt better with Trelegy samples. PFT's performed consistent with COPD.   Patient is a smoking and has been smoking 1/2 pack a day for a few decades.  Ancillary information including prior medications, full medical/surgical/family/social histories, and PFTs (when available) are listed below and have been reviewed.   Review of Systems  Constitutional:  Negative for chills, fever, malaise/fatigue and weight loss.  Respiratory:  Positive for shortness of breath. Negative for cough, hemoptysis, sputum production and wheezing.   Cardiovascular:  Negative for chest pain, palpitations, orthopnea, claudication and PND.  Skin:  Negative for rash.     Objective:   Vitals:   05/30/23 1128  BP: 130/78  Pulse: 71  Temp: 97.7 F (36.5 C)  TempSrc: Temporal  SpO2: 97%  Weight: 153 lb (69.4 kg)  Height: 5\' 5"  (1.651 m)   97% on RA  BMI Readings from Last 3 Encounters:  05/30/23 25.46 kg/m  02/24/23 26.23 kg/m  01/26/23 26.29 kg/m   Wt Readings from Last 3 Encounters:  05/30/23 153 lb (69.4 kg)  02/24/23 157 lb 9.6 oz (71.5 kg)  01/26/23 158 lb (71.7 kg)    Physical Exam Constitutional:      Appearance: Normal appearance.  HENT:     Head: Normocephalic.     Nose: Nose normal.     Mouth/Throat:     Mouth: Mucous membranes are moist.  Cardiovascular:     Rate and Rhythm: Normal rate and regular rhythm.     Pulses: Normal pulses.     Heart sounds: Normal heart sounds.  Pulmonary:     Effort: Pulmonary effort is normal.     Breath  sounds: Normal breath sounds. No wheezing or rales.  Abdominal:     Palpations: Abdomen is soft.  Musculoskeletal:     Cervical back: Neck supple.     Right lower leg: No edema.     Left lower leg: No edema.  Neurological:     Mental Status: She is alert. Mental status is at baseline.       Ancillary Information    Past Medical History:  Diagnosis Date   Hypercholesteremia    Hypertension      No family history on file.   Past Surgical History:  Procedure Laterality Date   ABDOMINAL HYSTERECTOMY      Social History   Socioeconomic History   Marital status: Divorced    Spouse name: Not on file   Number of children: Not on file   Years of education: Not on file   Highest education level: Not on file  Occupational History  Not on file  Tobacco Use   Smoking status: Every Day    Packs/day: 0.50    Years: 25.00    Additional pack years: 0.00    Total pack years: 12.50    Types: Cigarettes   Smokeless tobacco: Not on file  Substance and Sexual Activity   Alcohol use: Yes   Drug use: Not on file   Sexual activity: Not on file  Other Topics Concern   Not on file  Social History Narrative   Not on file   Social Determinants of Health   Financial Resource Strain: Not on file  Food Insecurity: No Food Insecurity (01/26/2023)   Hunger Vital Sign    Worried About Running Out of Food in the Last Year: Never true    Ran Out of Food in the Last Year: Never true  Transportation Needs: No Transportation Needs (01/26/2023)   PRAPARE - Administrator, Civil Service (Medical): No    Lack of Transportation (Non-Medical): No  Physical Activity: Not on file  Stress: Not on file  Social Connections: Not on file  Intimate Partner Violence: Not At Risk (01/26/2023)   Humiliation, Afraid, Rape, and Kick questionnaire    Fear of Current or Ex-Partner: No    Emotionally Abused: No    Physically Abused: No    Sexually Abused: No     No Known Allergies    CBC    Component Value Date/Time   WBC 9.1 01/27/2023 0444   RBC 4.24 01/27/2023 0444   HGB 14.5 01/27/2023 0444   HCT 42.3 01/27/2023 0444   PLT 322 01/27/2023 0444   MCV 99.8 01/27/2023 0444   MCH 34.2 (H) 01/27/2023 0444   MCHC 34.3 01/27/2023 0444   RDW 13.7 01/27/2023 0444   LYMPHSABS 0.8 01/27/2023 0444   MONOABS 0.1 01/27/2023 0444   EOSABS 0.0 01/27/2023 0444   BASOSABS 0.0 01/27/2023 0444    Pulmonary Functions Testing Results:    Latest Ref Rng & Units 04/21/2023    3:29 PM  PFT Results  FVC-Pre L 2.19   FVC-Predicted Pre % 67   FVC-Post L 2.23   FVC-Predicted Post % 68   Pre FEV1/FVC % % 55   Post FEV1/FCV % % 59   FEV1-Pre L 1.20   FEV1-Predicted Pre % 48   FEV1-Post L 1.31   DLCO uncorrected ml/min/mmHg 9.67   DLCO UNC% % 47   DLVA Predicted % 54   TLC L 5.76   TLC % Predicted % 110   RV % Predicted % 161     Outpatient Medications Prior to Visit  Medication Sig Dispense Refill   albuterol (VENTOLIN HFA) 108 (90 Base) MCG/ACT inhaler Inhale 2 puffs into the lungs every 6 (six) hours as needed for wheezing or shortness of breath. 8 g 2   amLODipine (NORVASC) 10 MG tablet Take 10 mg by mouth at bedtime.     aspirin EC 81 MG tablet Take 81 mg by mouth daily.     atenolol (TENORMIN) 100 MG tablet Take 100 mg by mouth daily.     atorvastatin (LIPITOR) 80 MG tablet Take 80 mg by mouth at bedtime.     glimepiride (AMARYL) 1 MG tablet Take 1 mg by mouth at bedtime.     potassium chloride (KLOR-CON M) 10 MEQ tablet Take 10 mEq by mouth daily.     valsartan (DIOVAN) 320 MG tablet Take 320 mg by mouth daily.     Vitamin D, Ergocalciferol, (  DRISDOL) 1.25 MG (50000 UNIT) CAPS capsule Take 50,000 Units by mouth every 7 (seven) days.     Fluticasone-Umeclidin-Vilant (TRELEGY ELLIPTA) 100-62.5-25 MCG/ACT AEPB Inhale 1 puff into the lungs daily. 14 each 0   No facility-administered medications prior to visit.

## 2023-06-21 ENCOUNTER — Encounter: Payer: Self-pay | Admitting: Urology

## 2023-06-21 ENCOUNTER — Ambulatory Visit: Payer: Medicare Other | Admitting: Urology

## 2023-06-21 VITALS — BP 118/69 | HR 90 | Ht 65.0 in | Wt 151.0 lb

## 2023-06-21 DIAGNOSIS — N2889 Other specified disorders of kidney and ureter: Secondary | ICD-10-CM | POA: Diagnosis not present

## 2023-06-21 DIAGNOSIS — E278 Other specified disorders of adrenal gland: Secondary | ICD-10-CM | POA: Diagnosis not present

## 2023-06-21 NOTE — Patient Instructions (Signed)

## 2023-06-21 NOTE — Progress Notes (Signed)
   06/21/23 3:52 PM   Dalma Pelley Sep 06, 1956 [id number]  CC: Right renal lesion, left adrenal lesion  HPI: 67 year old long-term smoker admitted in January 2024 for shortness of breath, CT chest at that time showed an indeterminate 2.4 cm left adrenal nodule as well as an exophytic 2.6 cm right upper pole renal lesion of unclear etiology.  She denies any gross hematuria.  No prior cross-sectional imaging to review.  Renal function is normal.   PMH: Past Medical History:  Diagnosis Date   Hypercholesteremia    Hypertension     Surgical History: Past Surgical History:  Procedure Laterality Date   ABDOMINAL HYSTERECTOMY      Family History: No family history on file.  Social History:  reports that she has been smoking cigarettes. She has a 12.50 pack-year smoking history. She does not have any smokeless tobacco history on file. She reports current alcohol use. No history on file for drug use.  Physical Exam: BP 118/69 (BP Location: Left Arm, Patient Position: Sitting, Cuff Size: Normal)   Pulse 90   Ht 5\' 5"  (1.651 m)   Wt 151 lb (68.5 kg)   BMI 25.13 kg/m    Constitutional:  Alert and oriented, No acute distress. Cardiovascular: No clubbing, cyanosis, or edema. Respiratory: Normal respiratory effort, no increased work of breathing. GI: Abdomen is soft, nontender, nondistended, no abdominal masses    Pertinent Imaging: I have personally viewed and interpreted the CT angio chest from 01/27/2023 showing an indeterminate 2.6 cm right upper pole renal lesion as well as a 2.4 cm left adrenal nodule..  Assessment & Plan:   67 year old female with indeterminant 2.4 cm left adrenal nodule and exophytic 2.6 cm right upper pole renal lesion of unclear etiology, incidentally seen on CT chest during hospitalization for shortness of breath.  We reviewed possible etiologies including renal cyst, hemorrhagic cyst, renal mass/malignancy, oncocytoma, need for further evaluation with CT  abdomen and pelvis with and without contrast.  Regarding the adrenal nodule, most likely benign but may need to consider endocrine metabolic workup with her history of hypertension.  CT abdomen and pelvis with and without contrast for further evaluation of indeterminate right renal mass and left adrenal lesion, consider referral to endocrine for further evaluation of metabolic activity of left adrenal lesion  Legrand Rams, MD 06/21/2023  [facility] [address], [address] [place], [place] [address] [phone]

## 2023-06-28 ENCOUNTER — Ambulatory Visit
Admission: RE | Admit: 2023-06-28 | Discharge: 2023-06-28 | Disposition: A | Discharge status: Home / Self Care | Payer: Medicare Other | Source: Ambulatory Visit | Attending: Urology | Admitting: Urology

## 2023-06-28 DIAGNOSIS — N2889 Other specified disorders of kidney and ureter: Secondary | ICD-10-CM | POA: Insufficient documentation

## 2023-06-28 LAB — POCT I-STAT CREATININE: Creatinine, Ser: 0.8 mg/dL (ref 0.44–1.00)

## 2023-06-28 MED ORDER — IOHEXOL 300 MG/ML  SOLN
100.0000 mL | Freq: Once | INTRAMUSCULAR | Status: AC | PRN
  Administered 2023-06-28: 100 mL via INTRAVENOUS

## 2023-07-05 ENCOUNTER — Telehealth: Payer: Self-pay

## 2023-07-05 NOTE — Telephone Encounter (Signed)
-----   Message from Sondra Come, MD sent at 07/05/2023  8:35 AM EDT ----- Randie Heinz news, no worrisome findings on CT scan, can follow-up with urology as needed  Legrand Rams, MD 07/05/2023

## 2023-07-05 NOTE — Telephone Encounter (Signed)
Called pt no answer. Unable to leave voicemail as it is full. 1st attempt.  °

## 2023-07-07 NOTE — Telephone Encounter (Signed)
Called pt no answer. Unable to leave message as voicemail is full. 2nd attempt.  

## 2023-07-08 NOTE — Telephone Encounter (Signed)
Patient advised.

## 2023-07-21 ENCOUNTER — Other Ambulatory Visit: Payer: Self-pay

## 2023-07-21 MED ORDER — ALBUTEROL SULFATE HFA 108 (90 BASE) MCG/ACT IN AERS: 2.0000 | INHALATION_SPRAY | Freq: Four times a day (QID) | RESPIRATORY_TRACT | 11 refills | Status: DC | PRN

## 2023-08-05 ENCOUNTER — Telehealth: Payer: Self-pay

## 2023-08-05 ENCOUNTER — Other Ambulatory Visit: Payer: Self-pay

## 2023-08-05 DIAGNOSIS — Z1211 Encounter for screening for malignant neoplasm of colon: Secondary | ICD-10-CM

## 2023-08-05 MED ORDER — GOLYTELY 236 G PO SOLR: 4000.0000 mL | Freq: Once | ORAL | 0 refills | Status: AC

## 2023-08-05 NOTE — Telephone Encounter (Signed)
Gastroenterology Pre-Procedure Review  Request Date: 09/23/23 Requesting Physician: Dr. Allegra Lai  PATIENT REVIEW QUESTIONS: The patient responded to the following health history questions as indicated:    1. Are you having any GI issues? no 2. Do you have a personal history of Polyps? no 3. Do you have a family history of Colon Cancer or Polyps? no 4. Diabetes Mellitus? Prediabetic takes glimepiride has been asked to stop 1 day prior to colonoscopy. 5. Joint replacements in the past 12 months?no 6. Major health problems in the past 3 months?no 7. Any artificial heart valves, MVP, or defibrillator?no    MEDICATIONS & ALLERGIES:    Patient reports the following regarding taking any anticoagulation/antiplatelet therapy:   Plavix, Coumadin, Eliquis, Xarelto, Lovenox, Pradaxa, Brilinta, or Effient? no Aspirin? yes  Patient confirms/reports the following medications:  Current Outpatient Medications  Medication Sig Dispense Refill   albuterol (VENTOLIN HFA) 108 (90 Base) MCG/ACT inhaler Inhale 2 puffs into the lungs every 6 (six) hours as needed for wheezing or shortness of breath. 20.1 g 11   amLODipine (NORVASC) 10 MG tablet Take 10 mg by mouth at bedtime.     aspirin EC 81 MG tablet Take 81 mg by mouth daily.     atenolol (TENORMIN) 100 MG tablet Take 100 mg by mouth daily.     atorvastatin (LIPITOR) 80 MG tablet Take 80 mg by mouth at bedtime.     glimepiride (AMARYL) 1 MG tablet Take 1 mg by mouth at bedtime.     nicotine polacrilex (NICOTINE MINI) 2 MG lozenge Take 1 lozenge (2 mg total) by mouth every 2 (two) hours as needed for smoking cessation. 72 lozenge 3   potassium chloride (KLOR-CON M) 10 MEQ tablet Take 10 mEq by mouth daily.     umeclidinium-vilanterol (ANORO ELLIPTA) 62.5-25 MCG/ACT AEPB Inhale 1 puff into the lungs daily. 30 each 12   valsartan (DIOVAN) 320 MG tablet Take 320 mg by mouth daily.     Vitamin D, Ergocalciferol, (DRISDOL) 1.25 MG (50000 UNIT) CAPS capsule Take  50,000 Units by mouth every 7 (seven) days.     No current facility-administered medications for this visit.    Patient confirms/reports the following allergies:  No Known Allergies  No orders of the defined types were placed in this encounter.   AUTHORIZATION INFORMATION Primary Insurance: 1D#: Group #:  Secondary Insurance: 1D#: Group #:  SCHEDULE INFORMATION: Date: 09/23/23 Time: Location: [facility]

## 2023-09-05 ENCOUNTER — Telehealth: Payer: Self-pay

## 2023-09-05 NOTE — Telephone Encounter (Signed)
Patient call has been returned.  Informed her that her procedure time will be given on the day before by calling to the hospital endoscopy department between 1pm-3pm.  Also confirmed that she has received her instructions.  She said she has received them.  Thanks,  [place], [place]

## 2023-09-16 ENCOUNTER — Encounter: Payer: Self-pay | Admitting: Gastroenterology

## 2023-09-22 ENCOUNTER — Encounter: Payer: Self-pay | Admitting: Gastroenterology

## 2023-09-23 ENCOUNTER — Ambulatory Visit: Admission: RE | Admit: 2023-09-23 | Payer: Medicare Other | Source: Home / Self Care | Admitting: Gastroenterology

## 2023-09-23 ENCOUNTER — Encounter: Admission: RE | Payer: Self-pay | Source: Home / Self Care

## 2023-09-23 SURGERY — COLONOSCOPY WITH PROPOFOL
Anesthesia: General

## 2023-10-25 ENCOUNTER — Telehealth: Payer: Self-pay

## 2023-10-25 ENCOUNTER — Other Ambulatory Visit: Payer: Self-pay

## 2023-10-25 DIAGNOSIS — Z1211 Encounter for screening for malignant neoplasm of colon: Secondary | ICD-10-CM

## 2023-10-25 NOTE — Telephone Encounter (Signed)
Patient called in to reschedule her procedure. She thought she had an procedure already reschedule.

## 2023-10-25 NOTE — Telephone Encounter (Signed)
Colonoscopy reschedule from canceled procedure.  Pt no longer takes Glimepiride or any other diabetic/prediabetic meds.  Colonoscopy scheduled with Dr. Allegra Lai 11/14/23.  Thanks,  [place], [place]

## 2023-11-14 ENCOUNTER — Ambulatory Visit: Payer: Medicare Other | Admitting: Certified Registered Nurse Anesthetist

## 2023-11-14 ENCOUNTER — Encounter
Admission: RE | Disposition: A | Discharge status: Home / Self Care | Payer: Self-pay | Source: Home / Self Care | Attending: Gastroenterology

## 2023-11-14 ENCOUNTER — Ambulatory Visit
Admission: RE | Admit: 2023-11-14 | Discharge: 2023-11-14 | Disposition: A | Discharge status: Home / Self Care | Payer: Medicare Other | Attending: Gastroenterology | Admitting: Gastroenterology

## 2023-11-14 DIAGNOSIS — E7800 Pure hypercholesterolemia, unspecified: Secondary | ICD-10-CM | POA: Diagnosis not present

## 2023-11-14 DIAGNOSIS — K639 Disease of intestine, unspecified: Secondary | ICD-10-CM | POA: Diagnosis not present

## 2023-11-14 DIAGNOSIS — K621 Rectal polyp: Secondary | ICD-10-CM

## 2023-11-14 DIAGNOSIS — Z79899 Other long term (current) drug therapy: Secondary | ICD-10-CM | POA: Diagnosis not present

## 2023-11-14 DIAGNOSIS — F17210 Nicotine dependence, cigarettes, uncomplicated: Secondary | ICD-10-CM | POA: Diagnosis not present

## 2023-11-14 DIAGNOSIS — Z1211 Encounter for screening for malignant neoplasm of colon: Secondary | ICD-10-CM | POA: Diagnosis not present

## 2023-11-14 DIAGNOSIS — K5730 Diverticulosis of large intestine without perforation or abscess without bleeding: Secondary | ICD-10-CM | POA: Insufficient documentation

## 2023-11-14 DIAGNOSIS — D123 Benign neoplasm of transverse colon: Secondary | ICD-10-CM | POA: Insufficient documentation

## 2023-11-14 DIAGNOSIS — I10 Essential (primary) hypertension: Secondary | ICD-10-CM | POA: Diagnosis not present

## 2023-11-14 DIAGNOSIS — K635 Polyp of colon: Secondary | ICD-10-CM

## 2023-11-14 HISTORY — PX: COLONOSCOPY WITH PROPOFOL: SHX5780

## 2023-11-14 SURGERY — COLONOSCOPY WITH PROPOFOL
Anesthesia: General

## 2023-11-14 MED ORDER — DEXMEDETOMIDINE HCL IN NACL 80 MCG/20ML IV SOLN
INTRAVENOUS | Status: AC
  Filled 2023-11-14: qty 20

## 2023-11-14 MED ORDER — LIDOCAINE HCL (CARDIAC) PF 100 MG/5ML IV SOSY
PREFILLED_SYRINGE | INTRAVENOUS | Status: DC | PRN
  Administered 2023-11-14: 50 mg via INTRAVENOUS

## 2023-11-14 MED ORDER — GLYCOPYRROLATE 0.2 MG/ML IJ SOLN
INTRAMUSCULAR | Status: DC | PRN
  Administered 2023-11-14: .2 mg via INTRAVENOUS

## 2023-11-14 MED ORDER — STERILE WATER FOR IRRIGATION IR SOLN
Status: DC | PRN
  Administered 2023-11-14: 180 mL

## 2023-11-14 MED ORDER — SODIUM CHLORIDE 0.9 % IV SOLN
INTRAVENOUS | Status: DC
  Administered 2023-11-14: 20 mL/h via INTRAVENOUS

## 2023-11-14 MED ORDER — PROPOFOL 500 MG/50ML IV EMUL
INTRAVENOUS | Status: DC | PRN
  Administered 2023-11-14: 150 ug/kg/min via INTRAVENOUS

## 2023-11-14 MED ORDER — DEXMEDETOMIDINE HCL IN NACL 80 MCG/20ML IV SOLN
INTRAVENOUS | Status: DC | PRN
  Administered 2023-11-14: 8 ug via INTRAVENOUS

## 2023-11-14 MED ORDER — PROPOFOL 10 MG/ML IV BOLUS
INTRAVENOUS | Status: DC | PRN
  Administered 2023-11-14: 60 mg via INTRAVENOUS
  Administered 2023-11-14: 20 mg via INTRAVENOUS

## 2023-11-14 MED ORDER — PROPOFOL 1000 MG/100ML IV EMUL
INTRAVENOUS | Status: AC
  Filled 2023-11-14: qty 100

## 2023-11-14 MED ORDER — LIDOCAINE HCL (PF) 2 % IJ SOLN
INTRAMUSCULAR | Status: AC
  Filled 2023-11-14: qty 5

## 2023-11-14 NOTE — Anesthesia Postprocedure Evaluation (Signed)
Anesthesia Post Note  Patient: Judy Martin  Procedure(s) Performed: COLONOSCOPY WITH PROPOFOL  Patient location during evaluation: Endoscopy Anesthesia Type: General Level of consciousness: awake and alert Pain management: pain level controlled Vital Signs Assessment: post-procedure vital signs reviewed and stable Respiratory status: spontaneous breathing, nonlabored ventilation, respiratory function stable and patient connected to nasal cannula oxygen Cardiovascular status: blood pressure returned to baseline and stable Postop Assessment: no apparent nausea or vomiting Anesthetic complications: no   No notable events documented.   Last Vitals:  Vitals:   11/14/23 0922 11/14/23 0932  BP: (!) 123/90 (!) 136/94  Pulse: 90 79  Resp: 13 13  Temp:    SpO2: 96% 96%    Last Pain:  Vitals:   11/14/23 0932  TempSrc:   PainSc: 0-No pain                 Louie Boston

## 2023-11-14 NOTE — Anesthesia Procedure Notes (Signed)
Procedure Name: MAC Date/Time: 11/14/2023 8:32 AM  Performed by: Hezzie Bump, CRNAPre-anesthesia Checklist: Patient identified, Emergency Drugs available, Suction available and Patient being monitored Patient Re-evaluated:Patient Re-evaluated prior to induction Oxygen Delivery Method: Nasal cannula Induction Type: IV induction Placement Confirmation: positive ETCO2

## 2023-11-14 NOTE — Transfer of Care (Signed)
Immediate Anesthesia Transfer of Care Note  Patient: Judy Martin  Procedure(s) Performed: COLONOSCOPY WITH PROPOFOL  Patient Location: PACU  Anesthesia Type:General  Level of Consciousness: awake, alert , and oriented  Airway & Oxygen Therapy: Patient Spontanous Breathing  Post-op Assessment: Report given to RN and Post -op Vital signs reviewed and stable  Post vital signs: Reviewed and stable  Last Vitals:  Vitals Value Taken Time  BP 92/49 11/14/23 0912  Temp 36.3 C 11/14/23 0912  Pulse 88 11/14/23 0913  Resp 12 11/14/23 0912  SpO2 90 % 11/14/23 0913  Vitals shown include unfiled device data.  Last Pain:  Vitals:   11/14/23 0912  TempSrc: Tympanic  PainSc: Asleep         Complications: No notable events documented.

## 2023-11-14 NOTE — Anesthesia Preprocedure Evaluation (Addendum)
Anesthesia Evaluation  Patient identified by MRN, date of birth, ID band Patient awake    Reviewed: Allergy & Precautions, NPO status , Patient's Chart, lab work & pertinent test results  History of Anesthesia Complications Negative for: history of anesthetic complications  Airway Mallampati: III  TM Distance: >3 FB Neck ROM: full    Dental  (+) Upper Dentures, Lower Dentures   Pulmonary Current Smoker   Pulmonary exam normal        Cardiovascular hypertension, On Medications Normal cardiovascular exam  Echo 1/24 IMPRESSIONS     1. Left ventricular ejection fraction, by estimation, is 60 to 65%. The  left ventricle has normal function. The left ventricle has no regional  wall motion abnormalities. There is moderate left ventricular hypertrophy.  Left ventricular diastolic  parameters are consistent with Grade I diastolic dysfunction (impaired  relaxation).   2. Right ventricular systolic function is normal. The right ventricular  size is normal. Tricuspid regurgitation signal is inadequate for assessing  PA pressure.   3. Left atrial size was mildly dilated.   4. The mitral valve is normal in structure. Mild mitral valve  regurgitation. No evidence of mitral stenosis.   5. The aortic valve is normal in structure. Aortic valve regurgitation is  not visualized. Aortic valve sclerosis is present, with no evidence of  aortic valve stenosis.   6. The inferior vena cava is normal in size with greater than 50%  respiratory variability, suggesting right atrial pressure of 3 mmHg.      Neuro/Psych negative neurological ROS  negative psych ROS   GI/Hepatic negative GI ROS, Neg liver ROS,,,  Endo/Other  negative endocrine ROS    Renal/GU negative Renal ROS  negative genitourinary   Musculoskeletal   Abdominal   Peds  Hematology negative hematology ROS (+)   Anesthesia Other Findings Past Medical History: No date:  Hypercholesteremia No date: Hypertension  Past Surgical History: No date: ABDOMINAL HYSTERECTOMY     Reproductive/Obstetrics negative OB ROS                             Anesthesia Physical Anesthesia Plan  ASA: 2  Anesthesia Plan: General   Post-op Pain Management: Minimal or no pain anticipated   Induction: Intravenous  PONV Risk Score and Plan: 2 and Propofol infusion and TIVA  Airway Management Planned: Natural Airway and Nasal Cannula  Additional Equipment:   Intra-op Plan:   Post-operative Plan:   Informed Consent: I have reviewed the patients History and Physical, chart, labs and discussed the procedure including the risks, benefits and alternatives for the proposed anesthesia with the patient or authorized representative who has indicated his/her understanding and acceptance.     Dental Advisory Given  Plan Discussed with: Anesthesiologist, CRNA and Surgeon  Anesthesia Plan Comments: (Patient consented for risks of anesthesia including but not limited to:  - adverse reactions to medications - risk of airway placement if required - damage to eyes, teeth, lips or other oral mucosa - nerve damage due to positioning  - sore throat or hoarseness - Damage to heart, brain, nerves, lungs, other parts of body or loss of life  Patient voiced understanding and assent.)        Anesthesia Quick Evaluation

## 2023-11-14 NOTE — H&P (Signed)
Arlyss Repress, MD [address]  [address]  [place], [place] [address]  Main: [phone]  Fax: [phone] Pager: [phone]  Primary Care Physician:  [facility] Primary Gastroenterologist:  Dr. Arlyss Repress  Pre-Procedure History & Physical: HPI:  Shametra Hsieh is a 67 y.o. female is here for an colonoscopy.   Past Medical History:  Diagnosis Date   Hypercholesteremia    Hypertension     Past Surgical History:  Procedure Laterality Date   ABDOMINAL HYSTERECTOMY      Prior to Admission medications   Medication Sig Start Date End Date Taking? Authorizing Provider  albuterol (VENTOLIN HFA) 108 (90 Base) MCG/ACT inhaler Inhale 2 puffs into the lungs every 6 (six) hours as needed for wheezing or shortness of breath. 07/21/23   Raechel Chute, MD  amLODipine (NORVASC) 10 MG tablet Take 10 mg by mouth at bedtime.    [provider]  aspirin EC 81 MG tablet Take 81 mg by mouth daily.    [provider]  atenolol (TENORMIN) 100 MG tablet Take 100 mg by mouth daily.    [provider]  atorvastatin (LIPITOR) 80 MG tablet Take 80 mg by mouth at bedtime.    [provider]  glimepiride (AMARYL) 1 MG tablet Take 1 mg by mouth at bedtime.    [provider]  potassium chloride (KLOR-CON M) 10 MEQ tablet Take 10 mEq by mouth daily.    [provider]  umeclidinium-vilanterol (ANORO ELLIPTA) 62.5-25 MCG/ACT AEPB Inhale 1 puff into the lungs daily. 05/30/23   Raechel Chute, MD  valsartan (DIOVAN) 320 MG tablet Take 320 mg by mouth daily.    [provider]  Vitamin D, Ergocalciferol, (DRISDOL) 1.25 MG (50000 UNIT) CAPS capsule Take 50,000 Units by mouth every 7 (seven) days.    [provider]    Allergies as of 10/25/2023   (No Known Allergies)    No family history on file.  Social History   Socioeconomic History   Marital status: Divorced    Spouse name: Not on file    Number of children: Not on file   Years of education: Not on file   Highest education level: Not on file  Occupational History   Not on file  Tobacco Use   Smoking status: Every Day    Current packs/day: 0.50    Average packs/day: 0.5 packs/day for 25.0 years (12.5 ttl pk-yrs)    Types: Cigarettes   Smokeless tobacco: Not on file  Vaping Use   Vaping status: Never Used  Substance and Sexual Activity   Alcohol use: Yes   Drug use: Never   Sexual activity: Not on file  Other Topics Concern   Not on file  Social History Narrative   Not on file   Social Determinants of Health   Financial Resource Strain: Not on file  Food Insecurity: No Food Insecurity (01/26/2023)   Hunger Vital Sign    Worried About Running Out of Food in the Last Year: Never true    Ran Out of Food in the Last Year: Never true  Transportation Needs: No Transportation Needs (01/26/2023)   PRAPARE - Administrator, Civil Service (Medical): No    Lack of Transportation (Non-Medical): No  Physical Activity: Not on file  Stress: Not on file  Social Connections: Not on file  Intimate Partner Violence: Not At Risk (01/26/2023)   Humiliation, Afraid, Rape, and Kick questionnaire  Fear of Current or Ex-Partner: No    Emotionally Abused: No    Physically Abused: No    Sexually Abused: No    Review of Systems: See HPI, otherwise negative ROS  Physical Exam: BP (!) 140/77   Pulse 93   Temp (!) 97.4 F (36.3 C) (Temporal)   Resp 20   Ht 5\' 5"  (1.651 m)   Wt 63.5 kg   SpO2 93%   BMI 23.30 kg/m  General:   Alert,  pleasant and cooperative in NAD Head:  Normocephalic and atraumatic. Neck:  Supple; no masses or thyromegaly. Lungs:  Clear throughout to auscultation.    Heart:  Regular rate and rhythm. Abdomen:  Soft, nontender and nondistended. Normal bowel sounds, without guarding, and without rebound.   Neurologic:  Alert and  oriented x4;  grossly normal  neurologically.  Impression/Plan: Ellasandra Asch is here for an colonoscopy to be performed for colon cancer screening  Risks, benefits, limitations, and alternatives regarding  colonoscopy have been reviewed with the patient.  Questions have been answered.  All parties agreeable.   Lannette Donath, MD  11/14/2023, 8:38 AM

## 2023-11-14 NOTE — Op Note (Signed)
[facility] Gastroenterology Patient Name: Judy Martin Procedure Date: 11/14/2023 8:35 AM MRN: [id number] Account #: [id number] Date of Birth: 1956/01/18 Admit Type: Outpatient Age: 67 Room: [facility] ENDO ROOM 4 Gender: Female Note Status: Finalized Instrument Name: Peds Colonoscope [id number] Procedure:             Colonoscopy Indications:           Screening for colorectal malignant neoplasm Providers:             Toney Reil MD, MD Referring MD:          No Local Md, MD (Referring MD) Medicines:             General Anesthesia Complications:         No immediate complications. Estimated blood loss: None. Procedure:             Pre-Anesthesia Assessment:                        - Prior to the procedure, a History and Physical was                         performed, and patient medications and allergies were                         reviewed. The patient is competent. The risks and                         benefits of the procedure and the sedation options and                         risks were discussed with the patient. All questions                         were answered and informed consent was obtained.                         Patient identification and proposed procedure were                         verified by the physician, the nurse, the                         anesthesiologist, the anesthetist and the technician                         in the pre-procedure area in the procedure room in the                         endoscopy suite. Mental Status Examination: alert and                         oriented. Airway Examination: normal oropharyngeal                         airway and neck mobility. Respiratory Examination:                         clear to auscultation. CV Examination: normal.  Prophylactic Antibiotics: The patient does not require                         prophylactic antibiotics. Prior Anticoagulants: The                          patient has taken no anticoagulant or antiplatelet                         agents. ASA Grade Assessment: III - A patient with                         severe systemic disease. After reviewing the risks and                         benefits, the patient was deemed in satisfactory                         condition to undergo the procedure. The anesthesia                         plan was to use general anesthesia. Immediately prior                         to administration of medications, the patient was                         re-assessed for adequacy to receive sedatives. The                         heart rate, respiratory rate, oxygen saturations,                         blood pressure, adequacy of pulmonary ventilation, and                         response to care were monitored throughout the                         procedure. The physical status of the patient was                         re-assessed after the procedure.                        After obtaining informed consent, the colonoscope was                         passed under direct vision. Throughout the procedure,                         the patient's blood pressure, pulse, and oxygen                         saturations were monitored continuously. The                         Colonoscope was introduced through the anus and  advanced to the the cecum, identified by appendiceal                         orifice and ileocecal valve. The colonoscopy was                         performed without difficulty. The patient tolerated                         the procedure fairly well. The quality of the bowel                         preparation was evaluated using the BBPS [facility] Bowel                         Preparation Scale) with scores of: Right Colon = 3,                         Transverse Colon = 3 and Left Colon = 3 (entire mucosa                         seen well with no residual staining, small fragments                          of stool or opaque liquid). The total BBPS score                         equals 9. The ileocecal valve, appendiceal orifice,                         and rectum were photographed. Findings:      The perianal and digital rectal examinations were normal. Pertinent       negatives include normal sphincter tone and no palpable rectal lesions.      Six sessile polyps were found in the transverse colon. The polyps were 3       to 8 mm in size. These polyps were removed with a cold snare. Resection       and retrieval were complete. Estimated blood loss: none.      A 4 mm polyp was found in the descending colon. The polyp was sessile.       The polyp was removed with a cold snare. Resection and retrieval were       complete. Estimated blood loss: none.      Two sessile polyps were found in the rectum. The polyps were 4 to 6 mm       in size. These polyps were removed with a cold snare. Resection and       retrieval were complete. Estimated blood loss: none.      The retroflexed view of the distal rectum and anal verge was normal and       showed no anal or rectal abnormalities.      Multiple small-mouthed diverticula were found in the sigmoid colon and       descending colon. Impression:            - Six 3 to 8 mm polyps in the transverse colon,  removed with a cold snare. Resected and retrieved.                        - One 4 mm polyp in the descending colon, removed with                         a cold snare. Resected and retrieved.                        - Two 4 to 6 mm polyps in the rectum, removed with a                         cold snare. Resected and retrieved.                        - The distal rectum and anal verge are normal on                         retroflexion view.                        - Diverticulosis in the sigmoid colon and in the                         descending colon. Recommendation:        - Discharge patient to home (with  escort).                        - Resume previous diet today.                        - Continue present medications.                        - Await pathology results.                        - Repeat colonoscopy in 3 years for surveillance of                         multiple polyps. Procedure Code(s):     --- Professional ---                        [phone], Colonoscopy, flexible; with removal of                         tumor(s), polyp(s), or other lesion(s) by snare                         technique Diagnosis Code(s):     --- Professional ---                        Z12.11, Encounter for screening for malignant neoplasm                         of colon                        D12.3, Benign neoplasm of transverse colon (hepatic  flexure or splenic flexure)                        D12.8, Benign neoplasm of rectum                        D12.4, Benign neoplasm of descending colon                        K57.30, Diverticulosis of large intestine without                         perforation or abscess without bleeding CPT copyright 2022 American Medical Association. All rights reserved. The codes documented in this report are preliminary and upon coder review may  be revised to meet current compliance requirements. Dr. Libby Maw Toney Reil MD, MD 11/14/2023 9:09:08 AM This report has been signed electronically. Number of Addenda: 0 Note Initiated On: 11/14/2023 8:35 AM Scope Withdrawal Time: 0 hours 18 minutes 36 seconds  Total Procedure Duration: 0 hours 23 minutes 13 seconds  Estimated Blood Loss:  Estimated blood loss: none.      [facility]

## 2023-11-15 ENCOUNTER — Encounter: Payer: Self-pay | Admitting: Gastroenterology

## 2023-11-16 ENCOUNTER — Other Ambulatory Visit: Payer: Self-pay | Admitting: Internal Medicine

## 2023-11-16 ENCOUNTER — Encounter: Payer: Self-pay | Admitting: Gastroenterology

## 2023-11-16 DIAGNOSIS — Z1231 Encounter for screening mammogram for malignant neoplasm of breast: Secondary | ICD-10-CM

## 2023-11-16 LAB — SURGICAL PATHOLOGY

## 2023-12-21 IMAGING — MG MM DIGITAL SCREENING BILAT W/ TOMO AND CAD
8 series · 8 of 24 positions shown · non-contrast
Comparison: Previous exam(s).

CLINICAL DATA: Screening.

EXAM:
DIGITAL SCREENING BILATERAL MAMMOGRAM WITH TOMOSYNTHESIS AND CAD
TECHNIQUE: Bilateral screening digital craniocaudal and mediolateral oblique
mammograms were obtained. Bilateral screening digital breast
tomosynthesis was performed. The images were evaluated with
computer-aided detection.

[R MLO synth-2D]
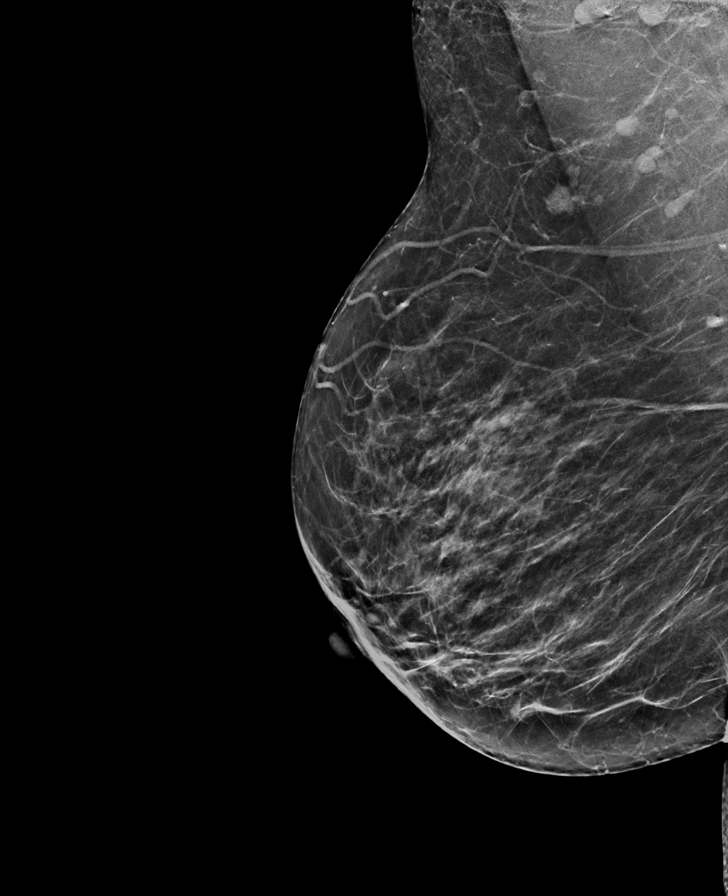

[L MLO synth-2D]
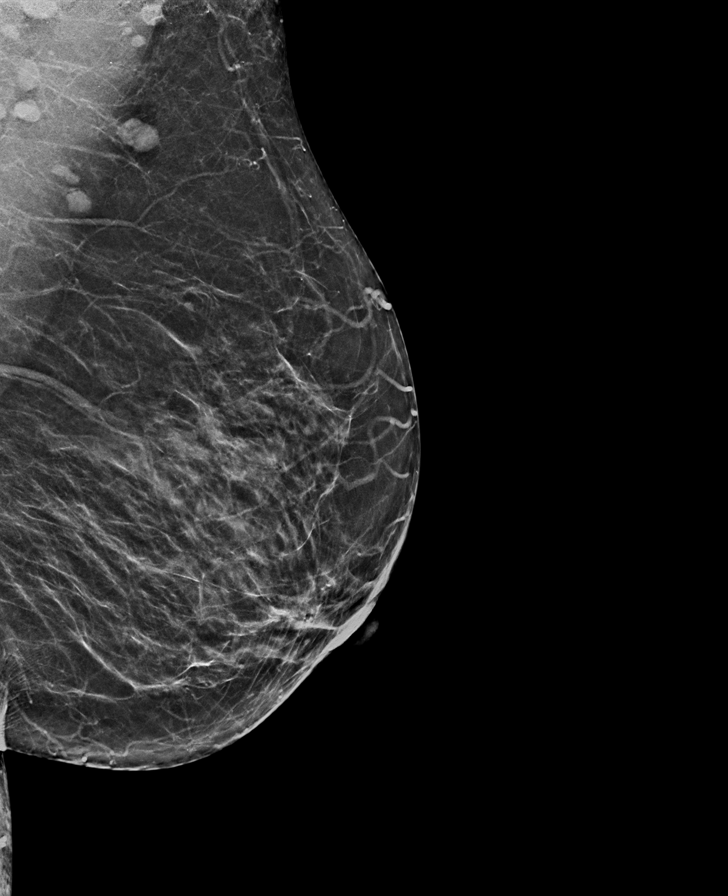

[L CC synth-2D]
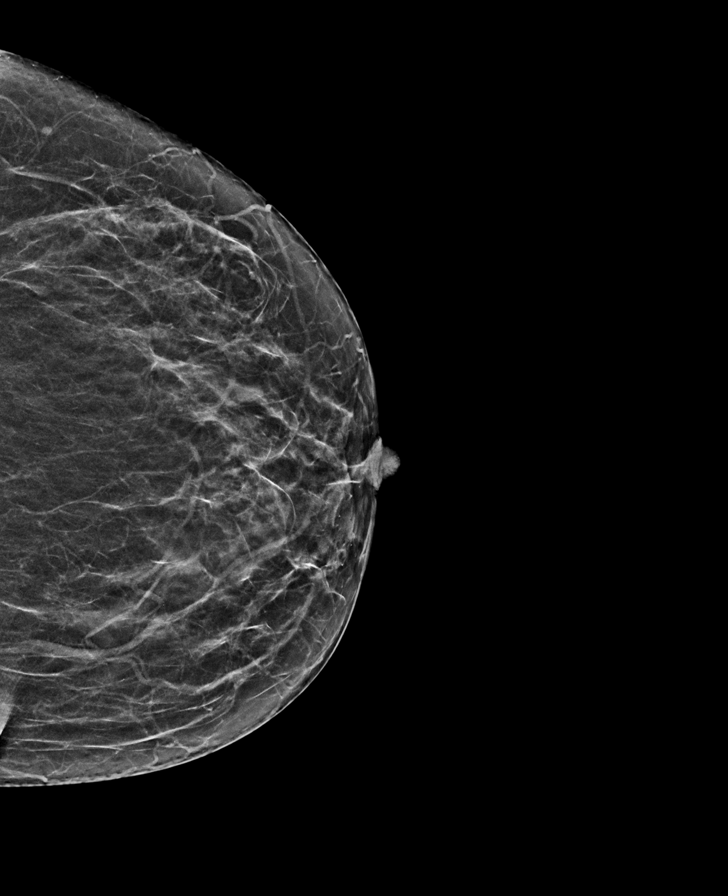

[R CC synth-2D]
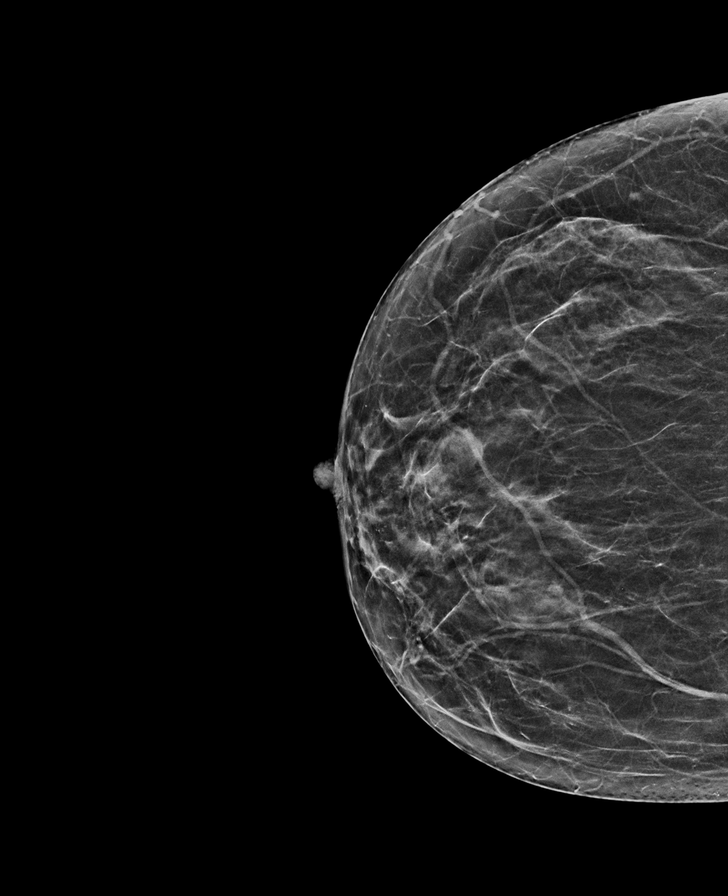

[R MLO tomo · tomo slice 31/62.0]
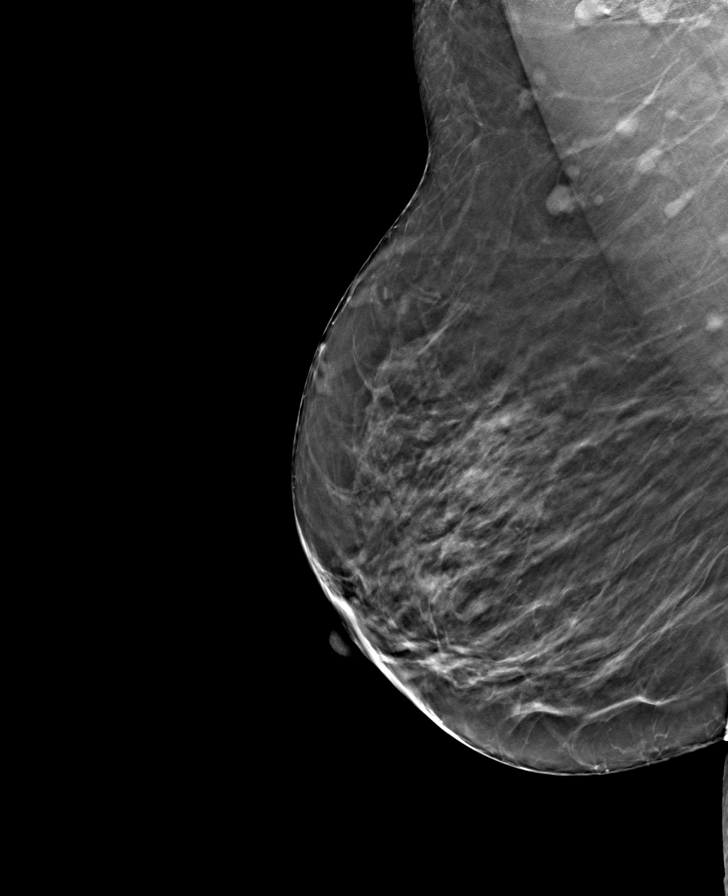

[R CC tomo · tomo slice 28/55.0]
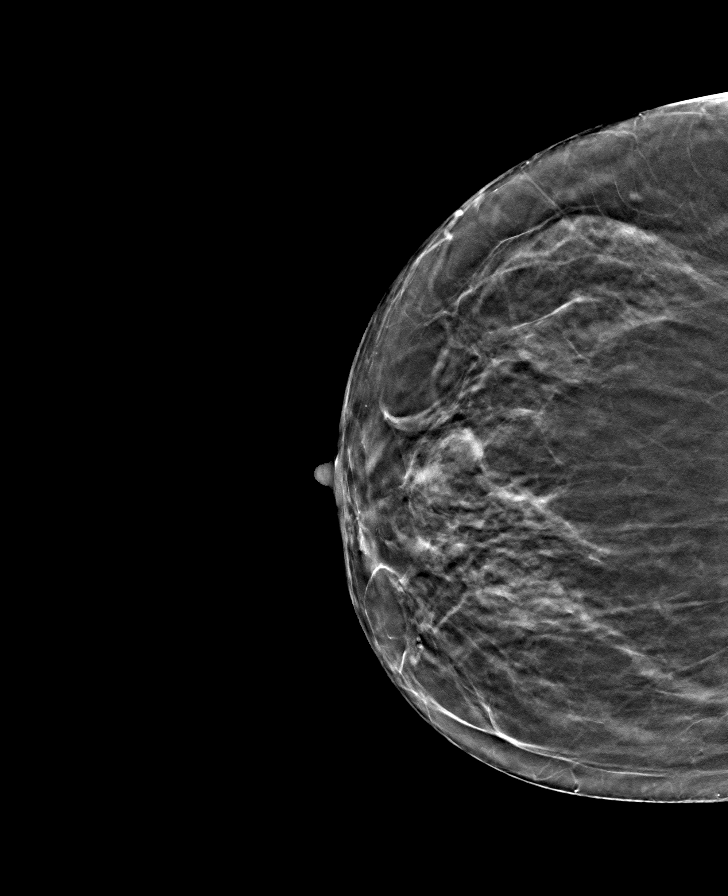

[L MLO tomo · tomo slice 30/59.0]
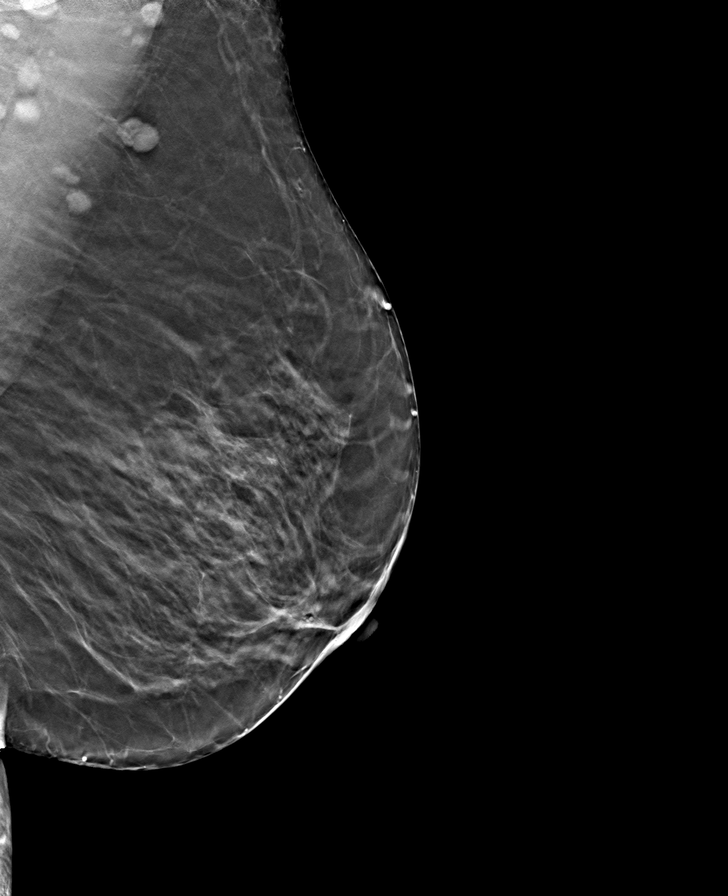

[L CC tomo · tomo slice 27/54.0]
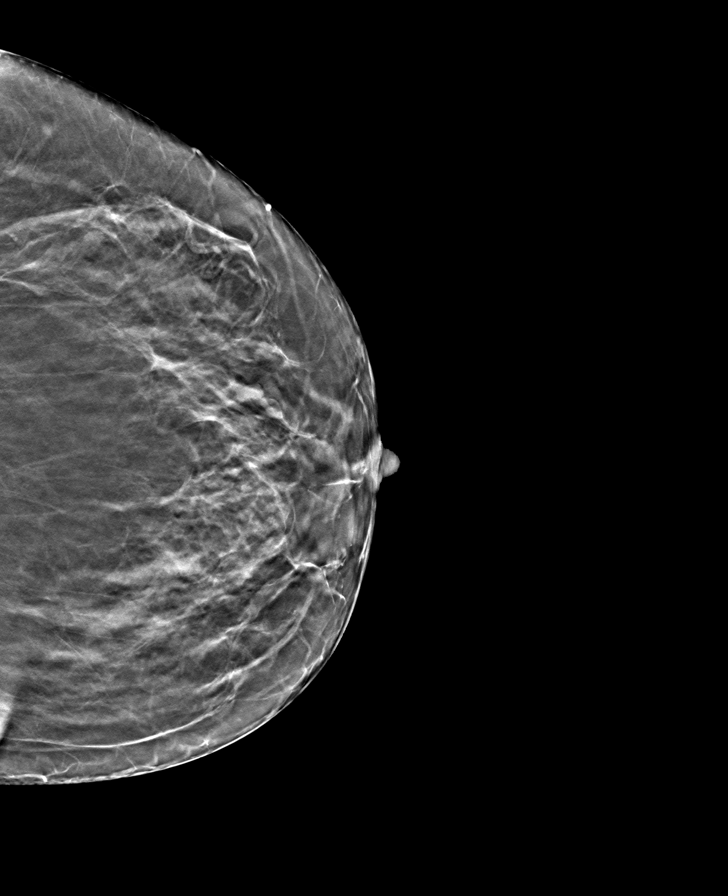

[8 of 24 positions shown; findings below may reference images not displayed]

ACR Breast Density Category b: There are scattered areas of
fibroglandular density.
FINDINGS: In the left breast, a possible mass warrants further evaluation. In
the right breast, no findings suspicious for malignancy.
IMPRESSION: Further evaluation is suggested for a possible mass in the left
breast.

RECOMMENDATION:
Diagnostic mammogram and possibly ultrasound of the left breast.
(Code:[id number])

The patient will be contacted regarding the findings, and additional
imaging will be scheduled.

BI-RADS CATEGORY  0: Incomplete. Need additional imaging evaluation
and/or prior mammograms for comparison.

## 2023-12-23 ENCOUNTER — Other Ambulatory Visit: Payer: Self-pay

## 2023-12-23 DIAGNOSIS — F17210 Nicotine dependence, cigarettes, uncomplicated: Secondary | ICD-10-CM

## 2023-12-23 DIAGNOSIS — Z122 Encounter for screening for malignant neoplasm of respiratory organs: Secondary | ICD-10-CM

## 2023-12-23 DIAGNOSIS — Z87891 Personal history of nicotine dependence: Secondary | ICD-10-CM

## 2024-01-01 NOTE — Progress Notes (Signed)
  Virtual Visit via Telephone Note  I connected with Judy Martin , 01/01/24 8:08 PM by a telemedicine application and verified that I am speaking with the correct person using two identifiers.  Location: Patient: home Provider: home   I discussed the limitations of evaluation and management by telemedicine and the availability of in person appointments. The patient expressed understanding and agreed to proceed.   Shared Decision Making Visit Lung Cancer Screening Program [phone])   Eligibility: 68 y.o. Pack Years Smoking History Calculation 43 x 2ppd= 86 pack years  (# packs/per year x # years smoked) Recent History of coughing up blood  no Unexplained weight loss? no ( >Than 15 pounds within the last 6 months ) Prior History Lung / other cancer no (Diagnosis within the last 5 years already requiring surveillance chest CT Scans). Smoking Status Current Smoker   Visit Components: Discussion included one or more decision making aids. YES Discussion included risk/benefits of screening. YES Discussion included potential follow up diagnostic testing for abnormal scans. YES Discussion included meaning and risk of over diagnosis. YES Discussion included meaning and risk of False Positives. YES Discussion included meaning of total radiation exposure. YES  Counseling Included: Importance of adherence to annual lung cancer LDCT screening. YES Impact of comorbidities on ability to participate in the program. YES Ability and willingness to under diagnostic treatment. YES  Smoking Cessation Counseling: Current Smokers:  Discussed importance of smoking cessation. yes Information about tobacco cessation classes and interventions provided to patient. yes Patient provided with ticket for LDCT Scan. yes Symptomatic Patient. NO Diagnosis Code: Tobacco Use Z72.0 Asymptomatic Patient yes  Counseling (Intermediate counseling: > three minutes counseling) H9563  Z12.2-Screening of  respiratory organs Z87.891-Personal history of nicotine  dependence   [name] 01/01/24

## 2024-01-01 NOTE — Patient Instructions (Signed)

## 2024-01-02 ENCOUNTER — Encounter: Payer: Self-pay | Admitting: Adult Health

## 2024-01-02 ENCOUNTER — Ambulatory Visit: Payer: Medicare Other | Admitting: Adult Health

## 2024-01-02 DIAGNOSIS — F17210 Nicotine dependence, cigarettes, uncomplicated: Secondary | ICD-10-CM

## 2024-01-10 ENCOUNTER — Ambulatory Visit
Admission: RE | Admit: 2024-01-10 | Discharge: 2024-01-10 | Disposition: A | Discharge status: Home / Self Care | Payer: Medicare Other | Source: Ambulatory Visit | Attending: Acute Care | Admitting: Acute Care

## 2024-01-10 DIAGNOSIS — Z122 Encounter for screening for malignant neoplasm of respiratory organs: Secondary | ICD-10-CM | POA: Diagnosis present

## 2024-01-10 DIAGNOSIS — F17210 Nicotine dependence, cigarettes, uncomplicated: Secondary | ICD-10-CM

## 2024-01-10 DIAGNOSIS — Z87891 Personal history of nicotine dependence: Secondary | ICD-10-CM

## 2024-01-19 ENCOUNTER — Telehealth: Payer: Self-pay | Admitting: Acute Care

## 2024-01-19 NOTE — Telephone Encounter (Signed)
Patient called for results of LDCT.  Advised results have not yet been read but we will notify patient once reviewed.  Patient acknowledged understanding  She prefers a phone call, as she states she is a little concerned about her results

## 2024-01-20 ENCOUNTER — Other Ambulatory Visit: Payer: Self-pay | Admitting: Acute Care

## 2024-01-20 DIAGNOSIS — Z122 Encounter for screening for malignant neoplasm of respiratory organs: Secondary | ICD-10-CM

## 2024-01-20 DIAGNOSIS — F17210 Nicotine dependence, cigarettes, uncomplicated: Secondary | ICD-10-CM

## 2024-01-20 DIAGNOSIS — Z87891 Personal history of nicotine dependence: Secondary | ICD-10-CM

## 2024-04-13 ENCOUNTER — Ambulatory Visit: Payer: Medicare Other | Admitting: Cardiology

## 2024-04-16 ENCOUNTER — Other Ambulatory Visit: Payer: Self-pay | Admitting: Internal Medicine

## 2024-04-16 DIAGNOSIS — Z1231 Encounter for screening mammogram for malignant neoplasm of breast: Secondary | ICD-10-CM

## 2024-04-18 ENCOUNTER — Ambulatory Visit: Admitting: Cardiology

## 2024-04-25 ENCOUNTER — Ambulatory Visit: Attending: Cardiology | Admitting: Cardiology

## 2024-04-25 ENCOUNTER — Encounter: Payer: Self-pay | Admitting: Cardiology

## 2024-04-25 VITALS — BP 147/78 | HR 60 | Resp 16 | Ht 65.0 in | Wt 133.8 lb

## 2024-04-25 DIAGNOSIS — I2510 Atherosclerotic heart disease of native coronary artery without angina pectoris: Secondary | ICD-10-CM

## 2024-04-25 DIAGNOSIS — R0602 Shortness of breath: Secondary | ICD-10-CM

## 2024-04-25 DIAGNOSIS — I10 Essential (primary) hypertension: Secondary | ICD-10-CM | POA: Diagnosis not present

## 2024-04-25 NOTE — Patient Instructions (Signed)
 Medication Instructions:  Your Physician recommend you continue on your current medication as directed.    *If you need a refill on your cardiac medications before your next appointment, please call your pharmacy*  Lab Work: No labs ordered today  If you have labs (blood work) drawn today and your tests are completely normal, you will receive your results only by: MyChart Message (if you have MyChart) OR A paper copy in the mail If you have any lab test that is abnormal or we need to change your treatment, we will call you to review the results.  Testing/Procedures: No test ordered today   Follow-Up: At [facility], you and your health needs are our priority.  As part of our continuing mission to provide you with exceptional heart care, our providers are all part of one team.  This team includes your primary Cardiologist (physician) and Advanced Practice Providers or APPs (Physician Assistants and Nurse Practitioners) who all work together to provide you with the care you need, when you need it.  Your next appointment:   3 month(s)  Provider:   You may see Dr. Junnie Olives or one of the following Advanced Practice Providers on your designated Care Team:   Laneta Pintos, NP Gildardo Labrador, PA-C Varney Gentleman, PA-C Cadence [place]u, PA-C Ronald Cockayne, NP Morey Ar, NP    We recommend signing up for the patient portal called "MyChart".  Sign up information is provided on this After Visit Summary.  MyChart is used to connect with patients for Virtual Visits (Telemedicine).  Patients are able to view lab/test results, encounter notes, upcoming appointments, etc.  Non-urgent messages can be sent to your provider as well.   To learn more about what you can do with MyChart, go to [email or web address].

## 2024-04-25 NOTE — Progress Notes (Signed)
 Cardiology Office Note:    Date:  04/25/2024   ID:  Judy Martin, DOB [birth year], MRN [id number]  PCP:  [facility]   [facility] Providers Cardiologist:  None     Referring MD: Twylla Galen, MD   Chief Complaint  Patient presents with   Coronary Artery Disease   New Patient (Initial Visit)    History of Present Illness:    Judy Martin is a 68 y.o. female with a hx of hypertension, hyperlipidemia, current smokes marijuana, previously smokes cigarettes, cigars.  Who presents due to coronary calcifications.  Patient has a chest CT for lung cancer screening 01/10/2024 showing LAD, RCA calcification, aortic atherosclerosis  Echocardiogram 12/2022 EF 60 to 65%, impaired relaxation.  She endorses shortness of breath with exertion which she attributes to being inactive.  She denies chest pain.  Had a pulmonary function test earlier this month showing severe airway disease consistent with COPD.  Endorsed eating salty foods this morning, compliant with medications as prescribed.  States blood pressure is usually adequately controlled.  Past Medical History:  Diagnosis Date   Hypercholesteremia    Hypertension     Past Surgical History:  Procedure Laterality Date   ABDOMINAL HYSTERECTOMY     COLONOSCOPY WITH PROPOFOL  N/A 11/14/2023   Procedure: COLONOSCOPY WITH PROPOFOL ;  Surgeon: Selena Daily, MD;  Location: [facility] ENDOSCOPY;  Service: Gastroenterology;  Laterality: N/A;    Current Medications: Current Meds  Medication Sig   amLODipine  (NORVASC ) 10 MG tablet Take 10 mg by mouth at bedtime.   aspirin  EC 81 MG tablet Take 81 mg by mouth daily.   atenolol  (TENORMIN ) 100 MG tablet Take 100 mg by mouth daily.   atorvastatin  (LIPITOR) 80 MG tablet Take 80 mg by mouth at bedtime.   potassium chloride  (KLOR-CON  M) 10 MEQ tablet Take 10 mEq by mouth daily.   umeclidinium-vilanterol (ANORO ELLIPTA ) 62.5-25 MCG/ACT AEPB Inhale 1 puff into  the lungs daily.   valsartan (DIOVAN) 320 MG tablet Take 320 mg by mouth daily.   Vitamin D, Ergocalciferol, (DRISDOL) 1.25 MG (50000 UNIT) CAPS capsule Take 50,000 Units by mouth every 7 (seven) days.     Allergies:   Patient has no known allergies.   Social History   Socioeconomic History   Marital status: Divorced    Spouse name: Not on file   Number of children: Not on file   Years of education: Not on file   Highest education level: Not on file  Occupational History   Not on file  Tobacco Use   Smoking status: Every Day    Current packs/day: 0.50    Average packs/day: 0.5 packs/day for 25.0 years (12.5 ttl pk-yrs)    Types: Cigarettes   Smokeless tobacco: Not on file  Vaping Use   Vaping status: Never Used  Substance and Sexual Activity   Alcohol use: Yes   Drug use: Never   Sexual activity: Not on file  Other Topics Concern   Not on file  Social History Narrative   Not on file   Social Drivers of Health   Financial Resource Strain: Not on file  Food Insecurity: No Food Insecurity (01/26/2023)   Hunger Vital Sign    Worried About Running Out of Food in the Last Year: Never true    Ran Out of Food in the Last Year: Never true  Transportation Needs: No Transportation Needs (01/26/2023)   PRAPARE - Administrator, Civil Service (Medical): No  Lack of Transportation (Non-Medical): No  Physical Activity: Not on file  Stress: Not on file  Social Connections: Not on file     Family History: The patient's family history is not on file.  ROS:   Please see the history of present illness.     All other systems reviewed and are negative.  EKGs/Labs/Other Studies Reviewed:    The following studies were reviewed today:  EKG Interpretation Date/Time:  Wednesday April 25 2024 10:54:07 EDT Ventricular Rate:  74 PR Interval:  148 QRS Duration:  76 QT Interval:  392 QTC Calculation: 435 R Axis:   -29  Text Interpretation: Sinus rhythm with marked  sinus arrhythmia Possible Left atrial enlargement Left ventricular hypertrophy ( R in aVL , Cornell product ) Confirmed by Constancia Delton ([id number]) on 04/25/2024 11:18:14 AM    Recent Labs: 06/28/2023: Creatinine, Ser 0.80  Recent Lipid Panel No results found for: "CHOL", "TRIG", "HDL", "CHOLHDL", "VLDL", "LDLCALC", "LDLDIRECT"   Risk Assessment/Calculations:         Physical Exam:    VS:  BP (!) 147/78 (BP Location: Left Arm, Patient Position: Sitting, Cuff Size: Normal)   Pulse 60   Resp 16   Ht 5\' 5"  (1.651 m)   Wt 133 lb 12.8 oz (60.7 kg)   SpO2 96%   BMI 22.27 kg/m     Wt Readings from Last 3 Encounters:  04/25/24 133 lb 12.8 oz (60.7 kg)  11/14/23 140 lb (63.5 kg)  06/21/23 151 lb (68.5 kg)     GEN:  Well nourished, well developed in no acute distress HEENT: Normal NECK: No JVD; No carotid bruits CARDIAC: RRR, no murmurs, rubs, gallops RESPIRATORY: Diminished breath sounds, no wheezing ABDOMEN: Soft, non-tender, non-distended MUSCULOSKELETAL:  No edema; No deformity  SKIN: Warm and dry NEUROLOGIC:  Alert and oriented x 3 PSYCHIATRIC:  Normal affect   ASSESSMENT:    1. Coronary artery disease involving native coronary artery of native heart, unspecified whether angina present   2. Primary hypertension   3. Shortness of breath    PLAN:    In order of problems listed above:  CAD, LAD, RCA calcifications on chest CT.  Aortic atherosclerosis noted.  Denies chest pain.  Echo with normal EF 60 to 65%.  Continue aspirin , high-dose Lipitor 80 mg daily. Hypertension, BP elevated, usually controlled.  Low-salt diet advised.  Continue Norvasc  10 mg daily, atenolol  100 mg daily, valsartan 320 mg daily. Shortness of breath, likely from COPD with PFT showing obstructive disease.  Denies chest pain.  Consider ischemic workup if dyspnea persists despite inhalers.  EF 60 to 65%  Follow-up in 3 months      Medication Adjustments/Labs and Tests Ordered: Current medicines  are reviewed at length with the patient today.  Concerns regarding medicines are outlined above.  Orders Placed This Encounter  Procedures   EKG 12-Lead   No orders of the defined types were placed in this encounter.   Patient Instructions  Medication Instructions:  Your Physician recommend you continue on your current medication as directed.    *If you need a refill on your cardiac medications before your next appointment, please call your pharmacy*  Lab Work: No labs ordered today  If you have labs (blood work) drawn today and your tests are completely normal, you will receive your results only by: MyChart Message (if you have MyChart) OR A paper copy in the mail If you have any lab test that is abnormal or we need to change  your treatment, we will call you to review the results.  Testing/Procedures: No test ordered today   Follow-Up: At [facility], you and your health needs are our priority.  As part of our continuing mission to provide you with exceptional heart care, our providers are all part of one team.  This team includes your primary Cardiologist (physician) and Advanced Practice Providers or APPs (Physician Assistants and Nurse Practitioners) who all work together to provide you with the care you need, when you need it.  Your next appointment:   3 month(s)  Provider:   You may see Dr. Junnie Olives or one of the following Advanced Practice Providers on your designated Care Team:   Laneta Pintos, NP Gildardo Labrador, PA-C Varney Gentleman, PA-C Cadence Souris, PA-C Ronald Cockayne, NP Morey Ar, NP    We recommend signing up for the patient portal called "MyChart".  Sign up information is provided on this After Visit Summary.  MyChart is used to connect with patients for Virtual Visits (Telemedicine).  Patients are able to view lab/test results, encounter notes, upcoming appointments, etc.  Non-urgent messages can be sent to your provider as well.   To learn  more about what you can do with MyChart, go to [email or web address].         Signed, Constancia Delton, MD  04/25/2024 1:14 PM    [facility]

## 2024-06-23 ENCOUNTER — Other Ambulatory Visit: Payer: Self-pay | Admitting: Student in an Organized Health Care Education/Training Program

## 2024-06-23 DIAGNOSIS — J42 Unspecified chronic bronchitis: Secondary | ICD-10-CM

## 2024-07-17 ENCOUNTER — Observation Stay
Admission: EM | Admit: 2024-07-17 | Discharge: 2024-07-18 | Disposition: A | Discharge status: Home / Self Care | Attending: Internal Medicine | Admitting: Internal Medicine

## 2024-07-17 ENCOUNTER — Encounter: Payer: Self-pay | Admitting: Emergency Medicine

## 2024-07-17 ENCOUNTER — Inpatient Hospital Stay

## 2024-07-17 ENCOUNTER — Other Ambulatory Visit: Payer: Self-pay

## 2024-07-17 DIAGNOSIS — Z7984 Long term (current) use of oral hypoglycemic drugs: Secondary | ICD-10-CM | POA: Insufficient documentation

## 2024-07-17 DIAGNOSIS — E1100 Type 2 diabetes mellitus with hyperosmolarity without nonketotic hyperglycemic-hyperosmolar coma (NKHHC): Secondary | ICD-10-CM | POA: Diagnosis present

## 2024-07-17 DIAGNOSIS — F1090 Alcohol use, unspecified, uncomplicated: Secondary | ICD-10-CM | POA: Insufficient documentation

## 2024-07-17 DIAGNOSIS — E1165 Type 2 diabetes mellitus with hyperglycemia: Secondary | ICD-10-CM | POA: Insufficient documentation

## 2024-07-17 DIAGNOSIS — E871 Hypo-osmolality and hyponatremia: Secondary | ICD-10-CM | POA: Diagnosis not present

## 2024-07-17 DIAGNOSIS — Z794 Long term (current) use of insulin: Secondary | ICD-10-CM | POA: Insufficient documentation

## 2024-07-17 DIAGNOSIS — I10 Essential (primary) hypertension: Secondary | ICD-10-CM | POA: Diagnosis present

## 2024-07-17 DIAGNOSIS — E1110 Type 2 diabetes mellitus with ketoacidosis without coma: Secondary | ICD-10-CM | POA: Diagnosis not present

## 2024-07-17 DIAGNOSIS — E876 Hypokalemia: Secondary | ICD-10-CM | POA: Diagnosis not present

## 2024-07-17 DIAGNOSIS — F17210 Nicotine dependence, cigarettes, uncomplicated: Secondary | ICD-10-CM | POA: Insufficient documentation

## 2024-07-17 DIAGNOSIS — E7800 Pure hypercholesterolemia, unspecified: Secondary | ICD-10-CM | POA: Diagnosis present

## 2024-07-17 DIAGNOSIS — E783 Hyperchylomicronemia: Secondary | ICD-10-CM | POA: Insufficient documentation

## 2024-07-17 DIAGNOSIS — Z79899 Other long term (current) drug therapy: Secondary | ICD-10-CM | POA: Insufficient documentation

## 2024-07-17 DIAGNOSIS — Z7982 Long term (current) use of aspirin: Secondary | ICD-10-CM | POA: Insufficient documentation

## 2024-07-17 DIAGNOSIS — E1101 Type 2 diabetes mellitus with hyperosmolarity with coma: Secondary | ICD-10-CM | POA: Diagnosis present

## 2024-07-17 DIAGNOSIS — R7303 Prediabetes: Secondary | ICD-10-CM | POA: Insufficient documentation

## 2024-07-17 LAB — BLOOD GAS, VENOUS

## 2024-07-17 LAB — URINALYSIS, ROUTINE W REFLEX MICROSCOPIC
Bacteria, UA: NONE SEEN
Bilirubin Urine: NEGATIVE
Glucose, UA: >=500 mg/dL — AB
Ketones, ur: 20 mg/dL — AB
Leukocytes,Ua: NEGATIVE
Nitrite: NEGATIVE
Protein, ur: NEGATIVE mg/dL
Specific Gravity, Urine: 1.029 (ref 1.005–1.030)
pH: 5 (ref 5.0–8.0)

## 2024-07-17 LAB — CBG MONITORING, ED
Glucose-Capillary: 204 mg/dL — ABNORMAL HIGH (ref 70–99)
Glucose-Capillary: 222 mg/dL — ABNORMAL HIGH (ref 70–99)
Glucose-Capillary: 228 mg/dL — ABNORMAL HIGH (ref 70–99)
Glucose-Capillary: 243 mg/dL — ABNORMAL HIGH (ref 70–99)
Glucose-Capillary: 246 mg/dL — ABNORMAL HIGH (ref 70–99)
Glucose-Capillary: 307 mg/dL — ABNORMAL HIGH (ref 70–99)
Glucose-Capillary: 325 mg/dL — ABNORMAL HIGH (ref 70–99)
Glucose-Capillary: 401 mg/dL — ABNORMAL HIGH (ref 70–99)
Glucose-Capillary: 494 mg/dL — ABNORMAL HIGH (ref 70–99)
Glucose-Capillary: >600 mg/dL (ref 70–99)

## 2024-07-17 LAB — BASIC METABOLIC PANEL WITH GFR
Anion gap: 16 — ABNORMAL HIGH (ref 5–15)
BUN: 15 mg/dL (ref 8–23)
CO2: 19 mmol/L — ABNORMAL LOW (ref 22–32)
Calcium: 9.5 mg/dL (ref 8.9–10.3)
Chloride: 97 mmol/L — ABNORMAL LOW (ref 98–111)
Creatinine, Ser: 0.82 mg/dL (ref 0.44–1.00)
GFR, Estimated: >60 mL/min (ref >60)
Glucose, Bld: 691 mg/dL (ref 70–99)
Potassium: 3.1 mmol/L — ABNORMAL LOW (ref 3.5–5.1)
Sodium: 132 mmol/L — ABNORMAL LOW (ref 135–145)

## 2024-07-17 LAB — CBC
HCT: 40.5 % (ref 36.0–46.0)
Hemoglobin: 14.4 g/dL (ref 12.0–15.0)
MCH: 31.5 pg (ref 26.0–34.0)
MCHC: 35.6 g/dL (ref 30.0–36.0)
MCV: 88.6 fL (ref 80.0–100.0)
Platelets: 224 K/uL (ref 150–400)
RBC: 4.57 MIL/uL (ref 3.87–5.11)
RDW: 12.2 % (ref 11.5–15.5)
WBC: 7.2 K/uL (ref 4.0–10.5)
nRBC: 0 % (ref 0.0–0.2)

## 2024-07-17 LAB — BETA-HYDROXYBUTYRIC ACID: Beta-Hydroxybutyric Acid: 2.23 mmol/L — ABNORMAL HIGH (ref 0.05–0.27)

## 2024-07-17 MED ORDER — ADULT MULTIVITAMIN W/MINERALS CH
1.0000 | ORAL_TABLET | Freq: Every day | ORAL | Status: DC
  Administered 2024-07-18: 1 via ORAL
  Filled 2024-07-17: qty 1

## 2024-07-17 MED ORDER — THIAMINE HCL 100 MG/ML IJ SOLN
100.0000 mg | Freq: Every day | INTRAMUSCULAR | Status: DC
Start: 2024-07-17 — End: 2024-07-18

## 2024-07-17 MED ORDER — POTASSIUM CHLORIDE 10 MEQ/100ML IV SOLN
10.0000 meq | INTRAVENOUS | Status: DC
  Administered 2024-07-17 (×2): 10 meq via INTRAVENOUS
  Filled 2024-07-17: qty 100

## 2024-07-17 MED ORDER — DEXTROSE IN LACTATED RINGERS 5 % IV SOLN: INTRAVENOUS | Status: DC

## 2024-07-17 MED ORDER — HEPARIN SODIUM (PORCINE) 5000 UNIT/ML IJ SOLN
5000.0000 [IU] | Freq: Three times a day (TID) | INTRAMUSCULAR | Status: DC
  Administered 2024-07-17 – 2024-07-18 (×3): 5000 [IU] via SUBCUTANEOUS
  Filled 2024-07-17 (×3): qty 1

## 2024-07-17 MED ORDER — THIAMINE MONONITRATE 100 MG PO TABS
100.0000 mg | ORAL_TABLET | Freq: Every day | ORAL | Status: DC
  Administered 2024-07-18: 100 mg via ORAL
  Filled 2024-07-17: qty 1

## 2024-07-17 MED ORDER — LACTATED RINGERS IV SOLN: INTRAVENOUS | Status: DC

## 2024-07-17 MED ORDER — LORAZEPAM 2 MG/ML IJ SOLN: 1.0000 mg | INTRAMUSCULAR | Status: DC | PRN

## 2024-07-17 MED ORDER — LORAZEPAM 1 MG PO TABS: 1.0000 mg | ORAL_TABLET | ORAL | Status: DC | PRN

## 2024-07-17 MED ORDER — FOLIC ACID 1 MG PO TABS
1.0000 mg | ORAL_TABLET | Freq: Every day | ORAL | Status: DC
  Administered 2024-07-18: 1 mg via ORAL
  Filled 2024-07-17: qty 1

## 2024-07-17 MED ORDER — ATORVASTATIN CALCIUM 20 MG PO TABS: 80.0000 mg | ORAL_TABLET | Freq: Every day | ORAL | Status: DC

## 2024-07-17 MED ORDER — HYDRALAZINE HCL 20 MG/ML IJ SOLN: 5.0000 mg | Freq: Four times a day (QID) | INTRAMUSCULAR | Status: DC | PRN

## 2024-07-17 MED ORDER — LACTATED RINGERS IV BOLUS
20.0000 mL/kg | Freq: Once | INTRAVENOUS | Status: AC
  Administered 2024-07-17: 1088 mL via INTRAVENOUS

## 2024-07-17 MED ORDER — POTASSIUM CITRATE-CITRIC ACID 1100-334 MG/5ML PO SOLN
10.0000 meq | Freq: Three times a day (TID) | ORAL | Status: AC
  Administered 2024-07-17 – 2024-07-18 (×2): 10 meq via ORAL
  Filled 2024-07-17 (×3): qty 5

## 2024-07-17 MED ORDER — DEXTROSE 50 % IV SOLN: 0.0000 mL | INTRAVENOUS | Status: DC | PRN

## 2024-07-17 MED ORDER — AMLODIPINE BESYLATE 5 MG PO TABS: 10.0000 mg | ORAL_TABLET | Freq: Every day | ORAL | Status: DC

## 2024-07-17 MED ORDER — INSULIN REGULAR(HUMAN) IN NACL 100-0.9 UT/100ML-% IV SOLN
INTRAVENOUS | Status: DC
  Administered 2024-07-17: 11 [IU]/h via INTRAVENOUS
  Filled 2024-07-17: qty 100

## 2024-07-17 MED ORDER — POTASSIUM CHLORIDE CRYS ER 20 MEQ PO TBCR
40.0000 meq | EXTENDED_RELEASE_TABLET | Freq: Once | ORAL | Status: AC
  Administered 2024-07-17: 40 meq via ORAL
  Filled 2024-07-17: qty 2

## 2024-07-17 NOTE — Assessment & Plan Note (Signed)
 Home amlodipine  10 mg nightly resume Hydralazine  5 mg IV every 6 hours as needed for SBP greater 180, 5 days ordered

## 2024-07-17 NOTE — Assessment & Plan Note (Addendum)
 Check magnesium  level Status post potassium chloride  infusion per EDP Patient lost an IV in the middle of treatment Potassium chloride  40 mEq p.o. one-time dose Polycitra 10 mEq 2 doses ordered Recheck BMP at every 4 hours, one-time check and then check BMP in the a.m.

## 2024-07-17 NOTE — Assessment & Plan Note (Signed)
 Check A1c in am

## 2024-07-17 NOTE — H&P (Signed)
 History and Physical   Judy Martin FMW:[id number] DOB: [birth year] DOA: 07/17/2024  PCP: [facility]  Patient coming from: Home  I have personally briefly reviewed patient's old medical records in [facility] EMR.  Chief Concern: High blood sugar  HPI: Judy Martin is a 68 year old female with history of hyperlipidemia, hypertension, prediabetes, presents emergency department for chief concerns of hyperglycemia.  Vitals in the ED showed t of 98.3, rr 17, hr 63, blood pressure 152/83, SpO2 98% on room air.  Serum sodium is 132, potassium 3.1, chloride 97, bicarb 19, BUN 15, serum creatinine 0.82, EGFR greater than 60, nonfasting blood glucose 691, WBC 7.2, hemoglobin 14.4, platelets of 224.  Beta-hydroxybutyrate was elevated 2.23.  ED treatment: Insulin  per Endo tool, LR infusion at 125 mL/h, potassium chloride  10 mEq IV, 2 doses were ordered, LR 1088 ml bolus given --------------------------- At bedside patient was able to tell me her first and last name, age, location, current calendar year.  She reports over the last few days she has been having increased urination and increased drinking more water .  She called her PCP and was told to come in and they checked her blood sugar and it was really high.  She was advised to come to the ED for further evaluation.  She reports that her last alcoholic drink was in May of this year.  She reports that previously she drank 2 bottles of wine per night.  She used to drink boxed wines.  She denies trauma to her person.  She denies feeling this way before.  She denies dysuria, hematuria, diarrhea, blood in her stool.  She denies nausea, vomiting, cough, chills, congestion.  Social history: She lives at home in her own home.  Her son lives with her.  She denies tobacco, current EtOH use, recreational drug use.  She is retired.  ROS: Constitutional: no weight change, no fever ENT/Mouth: no sore throat, no  rhinorrhea Eyes: no eye pain, no vision changes Cardiovascular: no chest pain, no dyspnea,  no edema, no palpitations Respiratory: no cough, no sputum, no wheezing Gastrointestinal: no nausea, no vomiting, no diarrhea, no constipation Genitourinary: no urinary incontinence, no dysuria, no hematuria, + polydipsia, + polyuria Musculoskeletal: no arthralgias, no myalgias Skin: no skin lesions, no pruritus, Neuro: + weakness, no loss of consciousness, no syncope Psych: no anxiety, no depression, no decrease appetite Heme/Lymph: no bruising, no bleeding  ED Course: Discussed with EDP, patient requiring hospitalization for chief concerns of hyperglycemia.  Assessment/Plan  Principal Problem:   Hyperosmolar hyperglycemic state (HHS) (HCC) Active Problems:   Essential hypertension   Hypokalemia   Hypercholesteremia   Pre-diabetes   Alcohol use    Assessment and Plan: * Hyperosmolar hyperglycemic state (HHS) (HCC) Continue insulin  per Endo tool EtOH level BMP check every 4 hours and then in the a.m. N.p.o. until blood sugar is less than 200 Check A1c Stepdown, inpatient  Essential hypertension Home amlodipine  10 mg nightly resume Hydralazine  5 mg IV every 6 hours as needed for SBP greater 180, 5 days ordered  Hypokalemia Check magnesium  level Status post potassium chloride  infusion per EDP Patient lost an IV in the middle of treatment Potassium chloride  40 mEq p.o. one-time dose Polycitra 10 mEq 2 doses ordered Recheck BMP at every 4 hours, one-time check and then check BMP in the a.m.  Alcohol use Patient reports she has been drinking a lot of boxed wine CIWA precaution initiated  Pre-diabetes Check A1c in a.m.  Hypercholesteremia Home  atorvastatin  80 mg nightly resumed  Chart reviewed.   DVT prophylaxis: Heparin  5000 units subcutaneous every 8 hours Code Status: Full code Diet: N.p.o. Family Communication: No Disposition Plan: Pending clinical course Consults  called: IV team Admission status: Stepdown, inpatient  Past Medical History:  Diagnosis Date   Hypercholesteremia    Hypertension    Past Surgical History:  Procedure Laterality Date   ABDOMINAL HYSTERECTOMY     COLONOSCOPY WITH PROPOFOL  N/A 11/14/2023   Procedure: COLONOSCOPY WITH PROPOFOL ;  Surgeon: Unk Corinn Skiff, MD;  Location: [facility] ENDOSCOPY;  Service: Gastroenterology;  Laterality: N/A;    Social History:  reports that she has been smoking cigarettes. She has a 12.5 pack-year smoking history. She does not have any smokeless tobacco history on file. She reports current alcohol use. She reports that she does not use drugs.  No Known Allergies Family History  Problem Relation Age of Onset   Diabetes Mother    Heart attack Mother    Stroke Brother    Family history: Family history reviewed and not pertinent.  Prior to Admission medications   Medication Sig Start Date End Date Taking? Authorizing Provider  amLODipine  (NORVASC ) 10 MG tablet Take 10 mg by mouth at bedtime.    [provider]  ANORO ELLIPTA  62.5-25 MCG/ACT AEPB INHALE ONE PUFF BY MOUTH ONCE DAILY 06/23/24   Isadora Hose, MD  aspirin  EC 81 MG tablet Take 81 mg by mouth daily.    [provider]  atenolol  (TENORMIN ) 100 MG tablet Take 100 mg by mouth daily.    [provider]  atorvastatin  (LIPITOR) 80 MG tablet Take 80 mg by mouth at bedtime.    [provider]  potassium chloride  (KLOR-CON  M) 10 MEQ tablet Take 10 mEq by mouth daily.    [provider]  valsartan (DIOVAN) 320 MG tablet Take 320 mg by mouth daily.    [provider]  Vitamin D, Ergocalciferol, (DRISDOL) 1.25 MG (50000 UNIT) CAPS capsule Take 50,000 Units by mouth every 7 (seven) days.    [provider]    Physical Exam: Vitals:   07/17/24 1730 07/17/24 1936 07/17/24 1937 07/17/24 1938  BP: (!) 183/80  (!) 143/65   Pulse:    67  Resp:   18 19  Temp:  98.2 F (36.8 C)     TempSrc:  Oral    SpO2: 99%   93%  Weight:      Height:       Constitutional: appears stated age, lacks judgment Eyes: PERRL, lids and conjunctivae normal ENMT: Mucous membranes are moist. Posterior pharynx clear of any exudate or lesions. Age-appropriate dentition. Hearing appropriate Neck: normal, supple, no masses, no thyromegaly Respiratory: clear to auscultation bilaterally, no wheezing, no crackles. Normal respiratory effort. No accessory muscle use.  Cardiovascular: Regular rate and rhythm, no murmurs / rubs / gallops. No extremity edema. 2+ pedal pulses. No carotid bruits.  Abdomen: no tenderness, no masses palpated, no hepatosplenomegaly. Bowel sounds positive.  Musculoskeletal: no clubbing / cyanosis. No joint deformity upper and lower extremities. Good ROM, no contractures, no atrophy. Normal muscle tone.  Skin: no rashes, lesions, ulcers. No induration Neurologic: Sensation intact. Strength 5/5 in all 4.  Psychiatric: Lacks judgment and insight. Alert and oriented x 3.  Weird mood.   EKG: Ordered and pending completion  Chest x-ray on Admission: I personally reviewed and I agree with radiologist reading as below.  Portable chest x-ray (1 view) Result Date: 07/17/2024 CLINICAL DATA:  Hyperglycemia  EXAM: PORTABLE CHEST 1 VIEW COMPARISON:  01/26/2023, chest CT 01/10/2024 FINDINGS: Emphysema. Streaky left greater than right basilar opacities. No pleural effusion. Stable cardiomediastinal silhouette with aortic atherosclerosis. No pneumothorax IMPRESSION: Emphysema. Streaky left greater than right basilar opacities, atelectasis versus mild infiltrate. Electronically Signed   By: Luke Bun M.D.   On: 07/17/2024 19:02   Labs on Admission: I have personally reviewed following labs CBC: Recent Labs  Lab 07/17/24 1245  WBC 7.2  HGB 14.4  HCT 40.5  MCV 88.6  PLT 224   Basic Metabolic Panel: Recent Labs  Lab 07/17/24 1245  NA 132*  K 3.1*  CL 97*  CO2 19*  GLUCOSE  691*  BUN 15  CREATININE 0.82  CALCIUM  9.5   GFR: Estimated Creatinine Clearance: 57.2 mL/min (by C-G formula based on SCr of 0.82 mg/dL).  CBG: Recent Labs  Lab 07/17/24 1743 07/17/24 1813 07/17/24 1847 07/17/24 1941 07/17/24 2048  GLUCAP 494* 401* 325* 307* 228*   CRITICAL CARE Performed by: Dr. Sherre  Total critical care time: 32 minutes  Critical care time was exclusive of separately billable procedures and treating other patients.  Critical care was necessary to treat or prevent imminent or life-threatening deterioration.  Critical care was time spent personally by me on the following activities: development of treatment plan with patient and/or surrogate as well as nursing, discussions with consultants, evaluation of patient's response to treatment, examination of patient, obtaining history from patient or surrogate, ordering and performing treatments and interventions, ordering and review of laboratory studies, ordering and review of radiographic studies, pulse oximetry and re-evaluation of patient's condition.  This document was prepared using Dragon Voice Recognition software and may include unintentional dictation errors.  Dr. Sherre [facility]  If 7PM-7AM, please contact overnight-coverage provider If 7AM-7PM, please contact day attending provider [email or web address]  07/17/2024, 8:59 PM

## 2024-07-17 NOTE — Assessment & Plan Note (Signed)
 Continue insulin  per Endo tool EtOH level BMP check every 4 hours and then in the a.m. N.p.o. until blood sugar is less than 200 Check A1c Stepdown, inpatient

## 2024-07-17 NOTE — ED Triage Notes (Signed)
 Patient to ED from PCP for hyperglycemia. PT reports blood sugar was 500 at the office. Denies hx of diabetes. Pt reports recently urinary incontinence. Does have some blurry vision.

## 2024-07-17 NOTE — Assessment & Plan Note (Signed)
 Home atorvastatin 80 mg nightly resumed

## 2024-07-17 NOTE — Assessment & Plan Note (Signed)
 Patient reports she has been drinking a lot of boxed wine CIWA precaution initiated

## 2024-07-17 NOTE — Hospital Course (Addendum)
 Ms. Judy Martin is a 68 year old female with history of hyperlipidemia, hypertension, prediabetes, presents emergency department for chief concerns of hyperglycemia.  Vitals in the ED showed t of 98.3, rr 17, hr 63, blood pressure 152/83, SpO2 98% on room air.  Serum sodium is 132, potassium 3.1, chloride 97, bicarb 19, BUN 15, serum creatinine 0.82, EGFR greater than 60, nonfasting blood glucose 691, WBC 7.2, hemoglobin 14.4, platelets of 224.  Beta-hydroxybutyrate was elevated 2.23.  ED treatment: Insulin  per Endo tool, LR infusion at 125 mL/h, potassium chloride  10 mEq IV, 2 doses were ordered, LR 1088 ml bolus given

## 2024-07-17 NOTE — ED Provider Notes (Signed)
 [facility] Provider Note   Event Date/Time   First MD Initiated Contact with Patient 07/17/24 1620     (approximate) History  Hyperglycemia  HPI Judy Martin is a 68 y.o. female with a past medical history of prediabetes not on medications, hypercholesterolemia, hypertension, who presents from primary care physician's office for hyperglycemia.  Patient's reported blood sugar in the office was 500.  Patient endorses polyuria, polydipsia, and blurred vision.  Patient is concerned that this may be secondary to drinking cheap wine.  Patient denies any history of full diagnosis of diabetes and states that she has been off any diabetes medicines as her sugar had improved over the last few months. ROS: Patient currently denies any vision changes, tinnitus, difficulty speaking, facial droop, sore throat, chest pain, shortness of breath, abdominal pain, nausea/vomiting/diarrhea, dysuria, or weakness/numbness/paresthesias in any extremity   Physical Exam  Triage Vital Signs: ED Triage Vitals  Encounter Vitals Group     BP 07/17/24 1242 (!) 152/83     Girls Systolic BP Percentile --      Girls Diastolic BP Percentile --      Boys Systolic BP Percentile --      Boys Diastolic BP Percentile --      Pulse Rate 07/17/24 1242 63     Resp 07/17/24 1242 17     Temp 07/17/24 1242 98.3 F (36.8 C)     Temp Source 07/17/24 1242 Oral     SpO2 07/17/24 1242 94 %     Weight 07/17/24 1243 120 lb (54.4 kg)     Height 07/17/24 1243 5' 5 (1.651 m)     Head Circumference --      Peak Flow --      Pain Score 07/17/24 1243 0     Pain Loc --      Pain Education --      Exclude from Growth Chart --    Most recent vital signs: Vitals:   07/18/24 1641 07/18/24 1700  BP: (!) 158/90 (!) 144/98  Pulse: 85 81  Resp: 16 16  Temp: 98.2 F (36.8 C)   SpO2: 96% 95%   General: Awake, oriented x4. CV:  Good peripheral perfusion. Resp:  Normal effort. Abd:  No  distention. Other:  Elderly well-developed, well-nourished Caucasian female resting comfortably in no acute distress ED Results / Procedures / Treatments  Labs (all labs ordered are listed, but only abnormal results are displayed) Labs Reviewed  BASIC METABOLIC PANEL WITH GFR - Abnormal; Notable for the following components:      Result Value   Sodium 132 (*)    Potassium 3.1 (*)    Chloride 97 (*)    CO2 19 (*)    Glucose, Bld 691 (*)    Anion gap 16 (*)    All other components within normal limits  URINALYSIS, ROUTINE W REFLEX MICROSCOPIC - Abnormal; Notable for the following components:   Color, Urine STRAW (*)    APPearance CLEAR (*)    Glucose, UA >=500 (*)    Hgb urine dipstick SMALL (*)    Ketones, ur 20 (*)    All other components within normal limits  BLOOD GAS, VENOUS - Abnormal; Notable for the following components:   pCO2, Ven 41 (*)    pO2, Ven 31 (*)    Acid-base deficit 2.9 (*)    All other components within normal limits  BETA-HYDROXYBUTYRIC ACID - Abnormal; Notable for the following components:   Beta-Hydroxybutyric Acid 2.23 (*)  All other components within normal limits  BASIC METABOLIC PANEL WITH GFR - Abnormal; Notable for the following components:   Potassium 3.3 (*)    CO2 20 (*)    Glucose, Bld 218 (*)    Calcium  8.6 (*)    All other components within normal limits  HEMOGLOBIN A1C - Abnormal; Notable for the following components:   Hgb A1c MFr Bld >15.5 (*)    All other components within normal limits  BASIC METABOLIC PANEL WITH GFR - Abnormal; Notable for the following components:   Potassium 2.8 (*)    Glucose, Bld 217 (*)    All other components within normal limits  MAGNESIUM  - Abnormal; Notable for the following components:   Magnesium  1.5 (*)    All other components within normal limits  PHOSPHORUS - Abnormal; Notable for the following components:   Phosphorus 1.7 (*)    All other components within normal limits  CBG MONITORING, ED -  Abnormal; Notable for the following components:   Glucose-Capillary >600 (*)    All other components within normal limits  CBG MONITORING, ED - Abnormal; Notable for the following components:   Glucose-Capillary 494 (*)    All other components within normal limits  CBG MONITORING, ED - Abnormal; Notable for the following components:   Glucose-Capillary 401 (*)    All other components within normal limits  CBG MONITORING, ED - Abnormal; Notable for the following components:   Glucose-Capillary 325 (*)    All other components within normal limits  CBG MONITORING, ED - Abnormal; Notable for the following components:   Glucose-Capillary 307 (*)    All other components within normal limits  CBG MONITORING, ED - Abnormal; Notable for the following components:   Glucose-Capillary 228 (*)    All other components within normal limits  CBG MONITORING, ED - Abnormal; Notable for the following components:   Glucose-Capillary 246 (*)    All other components within normal limits  CBG MONITORING, ED - Abnormal; Notable for the following components:   Glucose-Capillary 243 (*)    All other components within normal limits  CBG MONITORING, ED - Abnormal; Notable for the following components:   Glucose-Capillary 222 (*)    All other components within normal limits  CBG MONITORING, ED - Abnormal; Notable for the following components:   Glucose-Capillary 204 (*)    All other components within normal limits  CBG MONITORING, ED - Abnormal; Notable for the following components:   Glucose-Capillary 198 (*)    All other components within normal limits  CBG MONITORING, ED - Abnormal; Notable for the following components:   Glucose-Capillary 228 (*)    All other components within normal limits  CBG MONITORING, ED - Abnormal; Notable for the following components:   Glucose-Capillary 221 (*)    All other components within normal limits  CBG MONITORING, ED - Abnormal; Notable for the following components:    Glucose-Capillary 224 (*)    All other components within normal limits  CBG MONITORING, ED - Abnormal; Notable for the following components:   Glucose-Capillary 232 (*)    All other components within normal limits  CBG MONITORING, ED - Abnormal; Notable for the following components:   Glucose-Capillary 376 (*)    All other components within normal limits  CBG MONITORING, ED - Abnormal; Notable for the following components:   Glucose-Capillary 298 (*)    All other components within normal limits  CBC  ETHANOL  MAGNESIUM   POTASSIUM   PROCEDURES: Critical Care performed:  No Procedures MEDICATIONS ORDERED IN ED: Medications  lactated ringers  bolus 1,088 mL (0 mLs Intravenous Stopped 07/17/24 1937)  potassium chloride  SA (KLOR-CON  M) CR tablet 40 mEq (40 mEq Oral Given 07/17/24 2007)  citric acid -potassium citrate  (POLYCITRA) 10 mEq/5 ml solution 10 mEq (10 mEq Oral Given 07/18/24 1111)  magnesium  sulfate IVPB 2 g 50 mL (0 g Intravenous Stopped 07/18/24 0310)  potassium chloride  10 mEq in 100 mL IVPB (0 mEq Intravenous Stopped 07/18/24 0509)  potassium chloride  SA (KLOR-CON  M) CR tablet 40 mEq (40 mEq Oral Given 07/18/24 0755)  phosphorus (K PHOS  NEUTRAL) tablet 500 mg (500 mg Oral Given 07/18/24 1635)  insulin  starter kit- pen needles (English) 1 kit (1 kit Other Given 07/18/24 1734)  insulin  glargine-yfgn (SEMGLEE ) injection 14 Units (14 Units Subcutaneous Given 07/18/24 1636)   IMPRESSION / MDM / ASSESSMENT AND PLAN / ED COURSE  I reviewed the triage vital signs and the nursing notes.                             The patient is on the cardiac monitor to evaluate for evidence of arrhythmia and/or significant heart rate changes. Patient's presentation is most consistent with acute presentation with potential threat to life or bodily function. Patient's presentation most consistent with hyperglycemic state WITHOUT evidence of DKA. Given Exam, History, and Workup I have low suspicion for an  emergent precipitating factor of this hyperglycemic state such as atypical MI, acute abdomen, or other serious bacterial illness. Patient is type II diabetic with nonadherence in medication regimen/adherence. pH: 7.35 Potassium: 3.3 Given anion gap of 16 and hyperglycemia, will start empiric insulin  drip Patient started on continuous fluid boluses of LR, insulin  drip, potassium chloride  supplemented as needed given potassium level at each BMP  Dispo: Admit    FINAL CLINICAL IMPRESSION(S) / ED DIAGNOSES   Final diagnoses:  Hyperosmolar hyperglycemic state (HHS) (HCC)  Hyperglycemic hyperosmolar nonketotic coma (HCC)   Rx / DC Orders   ED Discharge Orders          Ordered    insulin  glargine (LANTUS  SOLOSTAR) 100 UNIT/ML Solostar Pen  Daily       Note to Pharmacy: per prescriber note, may substitute as needed per insurance. jef 07/18/24   07/18/24 1507    insulin  lispro (HUMALOG  KWIKPEN) 100 UNIT/ML KwikPen  3 times daily        07/18/24 1507    Blood Glucose Monitoring Suppl (BLOOD GLUCOSE MONITOR SYSTEM) w/Device KIT  3 times daily        07/18/24 1507    Glucose Blood (BLOOD GLUCOSE TEST STRIPS) STRP  3 times daily        07/18/24 1507    Lancet Device MISC  3 times daily        07/18/24 1507    Accu-Chek Softclix Lancets lancets  3 times daily        07/18/24 1507    Insulin  Pen Needle (PEN NEEDLES) 31G X 8 MM MISC  3 times daily        07/18/24 1507    Amb Referral to Nutrition and Diabetic Education        07/18/24 1507    Increase activity slowly        07/18/24 1527    Diet Carb Modified        07/18/24 1527    AMB Referral [facility] Care Management       Comments: Patient  started on insulin  and being discharged from the ED with both Lantus  and Novolog .   Needs lots of reinforcement.  Taught her how to use insulin  pens, meter, etc.  But this is a huge change for her. Needs to follow-up with PCP ASAP.   07/18/24 1613           Note:  This document was prepared  using Dragon voice recognition software and may include unintentional dictation errors.   Vincenta Steffey K, MD 07/20/24 [phone]

## 2024-07-18 ENCOUNTER — Other Ambulatory Visit: Payer: Self-pay

## 2024-07-18 DIAGNOSIS — I10 Essential (primary) hypertension: Secondary | ICD-10-CM | POA: Diagnosis not present

## 2024-07-18 DIAGNOSIS — E1101 Type 2 diabetes mellitus with hyperosmolarity with coma: Secondary | ICD-10-CM | POA: Diagnosis present

## 2024-07-18 DIAGNOSIS — E876 Hypokalemia: Secondary | ICD-10-CM | POA: Diagnosis not present

## 2024-07-18 DIAGNOSIS — E1100 Type 2 diabetes mellitus with hyperosmolarity without nonketotic hyperglycemic-hyperosmolar coma (NKHHC): Secondary | ICD-10-CM | POA: Diagnosis not present

## 2024-07-18 LAB — CBG MONITORING, ED
Glucose-Capillary: 198 mg/dL — ABNORMAL HIGH (ref 70–99)
Glucose-Capillary: 221 mg/dL — ABNORMAL HIGH (ref 70–99)
Glucose-Capillary: 224 mg/dL — ABNORMAL HIGH (ref 70–99)
Glucose-Capillary: 228 mg/dL — ABNORMAL HIGH (ref 70–99)
Glucose-Capillary: 232 mg/dL — ABNORMAL HIGH (ref 70–99)
Glucose-Capillary: 298 mg/dL — ABNORMAL HIGH (ref 70–99)
Glucose-Capillary: 376 mg/dL — ABNORMAL HIGH (ref 70–99)

## 2024-07-18 LAB — BASIC METABOLIC PANEL WITH GFR
Anion gap: 12 (ref 5–15)
Anion gap: 11 (ref 5–15)
BUN: 12 mg/dL (ref 8–23)
BUN: 11 mg/dL (ref 8–23)
CO2: 22 mmol/L (ref 22–32)
CO2: 20 mmol/L — ABNORMAL LOW (ref 22–32)
Calcium: 8.9 mg/dL (ref 8.9–10.3)
Calcium: 8.6 mg/dL — ABNORMAL LOW (ref 8.9–10.3)
Chloride: 103 mmol/L (ref 98–111)
Chloride: 105 mmol/L (ref 98–111)
Creatinine, Ser: 0.51 mg/dL (ref 0.44–1.00)
Creatinine, Ser: 0.6 mg/dL (ref 0.44–1.00)
GFR, Estimated: >60 mL/min (ref >60)
GFR, Estimated: >60 mL/min (ref >60)
Glucose, Bld: 217 mg/dL — ABNORMAL HIGH (ref 70–99)
Glucose, Bld: 218 mg/dL — ABNORMAL HIGH (ref 70–99)
Potassium: 2.8 mmol/L — ABNORMAL LOW (ref 3.5–5.1)
Potassium: 3.3 mmol/L — ABNORMAL LOW (ref 3.5–5.1)
Sodium: 137 mmol/L (ref 135–145)
Sodium: 136 mmol/L (ref 135–145)

## 2024-07-18 LAB — ETHANOL: Alcohol, Ethyl (B): <15 mg/dL (ref <15)

## 2024-07-18 LAB — PHOSPHORUS: Phosphorus: 1.7 mg/dL — ABNORMAL LOW (ref 2.5–4.6)

## 2024-07-18 LAB — MAGNESIUM
Magnesium: 1.5 mg/dL — ABNORMAL LOW (ref 1.7–2.4)
Magnesium: 2.1 mg/dL (ref 1.7–2.4)

## 2024-07-18 LAB — POTASSIUM: Potassium: 3.9 mmol/L (ref 3.5–5.1)

## 2024-07-18 MED ORDER — INSULIN GLARGINE-YFGN 100 UNIT/ML ~~LOC~~ SOLN
14.0000 [IU] | Freq: Once | SUBCUTANEOUS | Status: AC
  Administered 2024-07-18: 14 [IU] via SUBCUTANEOUS
  Filled 2024-07-18: qty 0.14

## 2024-07-18 MED ORDER — LANCET DEVICE MISC
1.0000 | Freq: Three times a day (TID) | 0 refills | Status: AC
  Filled 2024-07-18: qty 1, fill #0

## 2024-07-18 MED ORDER — LANTUS SOLOSTAR 100 UNIT/ML ~~LOC~~ SOPN
25.0000 [IU] | PEN_INJECTOR | Freq: Every day | SUBCUTANEOUS | 0 refills | Status: AC
  Filled 2024-07-18: qty 15, 60d supply, fill #0

## 2024-07-18 MED ORDER — ACCU-CHEK SOFTCLIX LANCETS MISC
1.0000 | Freq: Three times a day (TID) | 0 refills | Status: AC
  Filled 2024-07-18: qty 100, 30d supply, fill #0

## 2024-07-18 MED ORDER — LIVING WELL WITH DIABETES BOOK
Freq: Once | Status: DC
  Filled 2024-07-18: qty 1

## 2024-07-18 MED ORDER — INSULIN ASPART 100 UNIT/ML IJ SOLN: 0.0000 [IU] | Freq: Three times a day (TID) | INTRAMUSCULAR | Status: DC

## 2024-07-18 MED ORDER — BLOOD GLUCOSE MONITOR SYSTEM W/DEVICE KIT
1.0000 | PACK | Freq: Three times a day (TID) | 0 refills | Status: AC
  Filled 2024-07-18: qty 1, 30d supply, fill #0

## 2024-07-18 MED ORDER — POTASSIUM CHLORIDE CRYS ER 20 MEQ PO TBCR: 20.0000 meq | EXTENDED_RELEASE_TABLET | Freq: Once | ORAL | Status: DC

## 2024-07-18 MED ORDER — INSULIN GLARGINE-YFGN 100 UNIT/ML ~~LOC~~ SOLN
10.0000 [IU] | Freq: Every day | SUBCUTANEOUS | Status: DC
  Filled 2024-07-18: qty 0.1

## 2024-07-18 MED ORDER — POTASSIUM CHLORIDE 10 MEQ/100ML IV SOLN
10.0000 meq | INTRAVENOUS | Status: AC
  Administered 2024-07-18 (×4): 10 meq via INTRAVENOUS
  Filled 2024-07-18 (×2): qty 100

## 2024-07-18 MED ORDER — POTASSIUM CHLORIDE CRYS ER 20 MEQ PO TBCR
40.0000 meq | EXTENDED_RELEASE_TABLET | Freq: Once | ORAL | Status: AC
  Administered 2024-07-18: 40 meq via ORAL
  Filled 2024-07-18: qty 2

## 2024-07-18 MED ORDER — PEN NEEDLES 31G X 8 MM MISC
1.0000 | Freq: Three times a day (TID) | 0 refills | Status: AC
  Filled 2024-07-18: qty 100, 30d supply, fill #0

## 2024-07-18 MED ORDER — MAGNESIUM SULFATE 2 GM/50ML IV SOLN
2.0000 g | Freq: Once | INTRAVENOUS | Status: AC
  Administered 2024-07-18: 2 g via INTRAVENOUS
  Filled 2024-07-18: qty 50

## 2024-07-18 MED ORDER — K PHOS MONO-SOD PHOS DI & MONO 155-852-130 MG PO TABS
500.0000 mg | ORAL_TABLET | ORAL | Status: AC
  Administered 2024-07-18 (×2): 500 mg via ORAL
  Filled 2024-07-18 (×2): qty 2

## 2024-07-18 MED ORDER — BLOOD GLUCOSE TEST VI STRP
1.0000 | ORAL_STRIP | Freq: Three times a day (TID) | 0 refills | Status: AC
  Filled 2024-07-18: qty 100, 34d supply, fill #0

## 2024-07-18 MED ORDER — INSULIN ASPART 100 UNIT/ML IJ SOLN: 0.0000 [IU] | Freq: Every day | INTRAMUSCULAR | Status: DC

## 2024-07-18 MED ORDER — K PHOS MONO-SOD PHOS DI & MONO 155-852-130 MG PO TABS
500.0000 mg | ORAL_TABLET | Freq: Four times a day (QID) | ORAL | Status: DC
  Administered 2024-07-18: 500 mg via ORAL
  Filled 2024-07-18 (×2): qty 2

## 2024-07-18 MED ORDER — INSULIN STARTER KIT- PEN NEEDLES (ENGLISH)
1.0000 | Freq: Once | Status: AC
  Administered 2024-07-18: 1
  Filled 2024-07-18: qty 1

## 2024-07-18 MED ORDER — HUMALOG KWIKPEN 100 UNIT/ML ~~LOC~~ SOPN
6.0000 [IU] | PEN_INJECTOR | Freq: Three times a day (TID) | SUBCUTANEOUS | 0 refills | Status: AC
  Filled 2024-07-18: qty 15, 84d supply, fill #0

## 2024-07-18 MED ORDER — INSULIN GLARGINE-YFGN 100 UNIT/ML ~~LOC~~ SOLN
12.0000 [IU] | Freq: Every day | SUBCUTANEOUS | Status: DC
  Administered 2024-07-18: 12 [IU] via SUBCUTANEOUS
  Filled 2024-07-18 (×2): qty 0.12

## 2024-07-18 MED ORDER — INSULIN ASPART 100 UNIT/ML IJ SOLN
0.0000 [IU] | INTRAMUSCULAR | Status: DC
  Administered 2024-07-18: 3 [IU] via SUBCUTANEOUS
  Administered 2024-07-18: 5 [IU] via SUBCUTANEOUS
  Administered 2024-07-18: 9 [IU] via SUBCUTANEOUS
  Filled 2024-07-18 (×4): qty 1

## 2024-07-18 NOTE — Care Management Obs Status (Signed)
 MEDICARE OBSERVATION STATUS NOTIFICATION   Patient Details  Name: Judy Martin MRN: [id number] Date of Birth: [birth year]   Medicare Observation Status Notification Given:  Yes    Edsel DELENA Fischer, LCSW 07/18/2024, 3:44 PM

## 2024-07-18 NOTE — TOC CM/SW Note (Signed)
..  Transition of Care [facility]) - Inpatient Brief Assessment   Patient Details  Name: Judy Martin MRN: [id number] Date of Birth: [birth year]  Transition of Care [facility]) CM/SW Contact:    Edsel DELENA Fischer, LCSW Phone Number: 07/18/2024, 6:01 PM   Clinical Narrative:  SW met with pt at bedside.  SW asked pt about any history of drug use.  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  SW spoke with pt about the dangers of using illegal substances and asked pt if she would like to quit.  Pt stated Imma start, Imma start.  SW added resources to pt discharge summary.  Transition of Care Asessment:

## 2024-07-18 NOTE — Consult Note (Signed)
 PHARMACY CONSULT NOTE - ELECTROLYTES  Pharmacy Consult for Electrolyte Monitoring and Replacement   Recent Labs: Height: 5' 5 (165.1 cm) Weight: 54.4 kg (120 lb) IBW/kg (Calculated) : 57 Estimated Creatinine Clearance: 58.6 mL/min (by C-G formula based on SCr of 0.6 mg/dL). Potassium (mmol/L)  Date Value  07/18/2024 3.3 (L)   Magnesium  (mg/dL)  Date Value  92/76/7974 2.1   Calcium  (mg/dL)  Date Value  92/76/7974 8.6 (L)   Phosphorus (mg/dL)  Date Value  92/76/7974 1.7 (L)   Sodium (mmol/L)  Date Value  07/18/2024 136    Assessment  Judy Martin is a 68 y.o. female presenting with HHS. PMH significant for HLD, HTN, and pre-DM. Pharmacy has been consulted to monitor and replace electrolytes.  Diet: heart healthy/carb modified MIVF: N/A Pertinent medications: Polycitra 10 mEq TID for 2 more doses  Goal of Therapy: Electrolytes WNL  Plan:  K = 3.3, give Kcl 40 mEq po x 1 Phos 1.7, give Kphos neutral 500 mg q6h x 2 Check BMP, Mg, Phos with AM labs  Thank you for allowing pharmacy to be a part of this patient's care.  Kayla JULIANNA Blew, PharmD Clinical Pharmacist 07/18/2024 7:06 AM

## 2024-07-18 NOTE — Inpatient Diabetes Management (Signed)
 Inpatient Diabetes Program Recommendations  AACE/ADA: New Consensus Statement on Inpatient Glycemic Control (2025)  Target Ranges:  Prepandial:   less than 140 mg/dL      Peak postprandial:   less than 180 mg/dL (1-2 hours)      Critically ill patients:  140 - 180 mg/dL   Lab Results  Component Value Date   GLUCAP 376 (H) 07/18/2024   HGBA1C 6.0 (H) 01/27/2023   Inpatient Diabetes Program Recommendations:    Note that patient is discharging home on insulin  per MD.  Education done regarding insulin  including long acting vs. Rapid acting insulin .   Educated patient on insulin  pen use at home.  Reviewed contents of insulin  flexpen starter kit.  Reviewed all steps if insulin  pen including attachment of needle, 2-unit air shot, dialing up dose, giving injection, removing needle, disposal of sharps, storage of unused insulin , disposal of insulin  etc.  Patient able to provide successful return demonstration.  Also reviewed troubleshooting with insulin  pen.  Patient has both Lantus  and Humalog  pens at bedside that were delivered by outpatient pharmacy.  I reiterated the importance of taking Humalog  with meals and that Lantus  is once daily.   Patient states that she does not want to be on insulin .  I encouraged follow-up with PCP ASAP in case insulin  adjustments needed.  We also reviewed in detail hypoglycemia symptoms and how to treat.   Accuchek meter also delivered.  Reviewed how to use meter, place strip, prick finger, etc.  Patient verbalized understanding.  Briefly discussed eliminating sugar from beverages, reducing portion sizes of starch/CHO heavy foods, etc.  Patient verbalized understanding.  Asked RN to please review with son as well when he comes for reinforcement.   Thanks,  Randall Bullocks, RN, BC-ADM Inpatient Diabetes Coordinator Pager [phone]  (8a-5p)

## 2024-07-18 NOTE — Discharge Summary (Signed)
 Physician Discharge Summary   Patient: Judy Martin MRN: [id number] DOB: [birth year]  Admit date:     07/17/2024  Discharge date: 07/18/24  Discharge Physician: Murvin Mana   PCP: [facility]   Recommendations at discharge:   Follow-up PCP as outpatient.  Discharge Diagnoses: Principal Problem:   Hyperosmolar hyperglycemic state (HHS) (HCC) Active Problems:   Essential hypertension   Hypokalemia   Hypercholesteremia   Pre-diabetes   Alcohol use  Resolved Problems:   * No resolved hospital problems. [facility] Course: Judy Martin is a 68 year old female with history of hyperlipidemia, hypertension, prediabetes, presents emergency department for chief concerns of hyperglycemia. She has a minimal elevation of anion gap with a CO2 of 19, was placed on insulin .  Anion gap closed last night.  Patient was transition to subcu insulin  at midnight last night. Patient condition continued to improve, at this point, patient is asymptomatic.  Prescribed subcu insulin  for patient to be filled while in the hospital.  Medically stable for discharge.  Assessment and Plan:  * Hyperosmolar hyperglycemic state (HHS) (HCC) Mild DKA Uncontrolled type 2 diabetes. She has transition to subcu insulin  last night, condition has improved.  Medically stable for discharge. Be followed with PCP in 1 week time, follow-up on A1c results which is not available yet.  Essential hypertension Home medicines  Hypokalemia Hypomagnesia. Hypophosphatemia Pseudohyponatremia. Repleted.  Alcohol use Advised to quit, patient has no evidence of alcohol withdrawal  Hypercholesteremia Home atorvastatin  80 mg nightly resumed       Consultants: None Procedures performed: None  Disposition: Home Diet recommendation:  Carb modified diet DISCHARGE MEDICATION: Allergies as of 07/18/2024   No Known Allergies      Medication List     TAKE these medications     amLODipine  10 MG tablet Commonly known as: NORVASC  Take 10 mg by mouth at bedtime.   Anoro Ellipta  62.5-25 MCG/ACT Aepb Generic drug: umeclidinium-vilanterol INHALE ONE PUFF BY MOUTH ONCE DAILY   aspirin  EC 81 MG tablet Take 81 mg by mouth daily.   atenolol  100 MG tablet Commonly known as: TENORMIN  Take 100 mg by mouth daily.   atorvastatin  80 MG tablet Commonly known as: LIPITOR Take 80 mg by mouth at bedtime.   Basaglar  KwikPen 100 UNIT/ML Inject 25 Units into the skin daily. May substitute as needed per insurance.   Blood Glucose Monitoring Suppl Devi 1 each by Does not apply route 3 (three) times daily. May dispense any manufacturer covered by patient's insurance.   BLOOD GLUCOSE TEST STRIPS Strp 1 each by Does not apply route 3 (three) times daily. Use as directed to check blood sugar. May dispense any manufacturer covered by patient's insurance and fits patient's device.   HumaLOG  KwikPen 100 UNIT/ML KwikPen Generic drug: insulin  lispro Inject 6 Units into the skin 3 (three) times daily.   Lancet Device Misc 1 each by Does not apply route 3 (three) times daily. May dispense any manufacturer covered by patient's insurance.   Lancets Misc 1 each by Does not apply route 3 (three) times daily. Use as directed to check blood sugar. May dispense any manufacturer covered by patient's insurance and fits patient's device.   metFORMIN 500 MG tablet Commonly known as: GLUCOPHAGE Take 500 mg by mouth 2 (two) times daily.   Pen Needles 31G X 8 MM Misc 1 each by Does not apply route 3 (three) times daily. May dispense any manufacturer covered by patient's insurance.   potassium chloride  10  MEQ tablet Commonly known as: KLOR-CON  M Take 10 mEq by mouth daily.   valsartan 320 MG tablet Commonly known as: DIOVAN Take 320 mg by mouth daily.   Vitamin D (Ergocalciferol) 1.25 MG (50000 UNIT) Caps capsule Commonly known as: DRISDOL Take 50,000 Units by mouth every 7 (seven)  days.        Follow-up Information     [facility] Follow up in 1 week(s).   Contact information: [address] [place] [address] [phone]                Discharge Exam: Filed Weights   07/17/24 1243  Weight: 54.4 kg   General exam: Appears calm and comfortable  Respiratory system: Clear to auscultation. Respiratory effort normal. Cardiovascular system: S1 & S2 heard, RRR. No JVD, murmurs, rubs, gallops or clicks. No pedal edema. Gastrointestinal system: Abdomen is nondistended, soft and nontender. No organomegaly or masses felt. Normal bowel sounds heard. Central nervous system: Alert and oriented. No focal neurological deficits. Extremities: Symmetric 5 x 5 power. Skin: No rashes, lesions or ulcers Psychiatry: Judgement and insight appear normal. Mood & affect appropriate.    Condition at discharge: good  The results of significant diagnostics from this hospitalization (including imaging, microbiology, ancillary and laboratory) are listed below for reference.   Imaging Studies: Portable chest x-ray (1 view) Result Date: 07/17/2024 CLINICAL DATA:  Hyperglycemia EXAM: PORTABLE CHEST 1 VIEW COMPARISON:  01/26/2023, chest CT 01/10/2024 FINDINGS: Emphysema. Streaky left greater than right basilar opacities. No pleural effusion. Stable cardiomediastinal silhouette with aortic atherosclerosis. No pneumothorax IMPRESSION: Emphysema. Streaky left greater than right basilar opacities, atelectasis versus mild infiltrate. Electronically Signed   By: Luke Bun M.D.   On: 07/17/2024 19:02    Microbiology: Results for orders placed or performed during the hospital encounter of 01/26/23  Resp panel by RT-PCR (RSV, Flu A&B, Covid) Anterior Nasal Swab     Status: None   Collection Time: 01/26/23  3:16 PM   Specimen: Anterior Nasal Swab  Result Value Ref Range Status   SARS Coronavirus 2 by RT PCR NEGATIVE NEGATIVE Final    Comment:  (NOTE) SARS-CoV-2 target nucleic acids are NOT DETECTED.  The SARS-CoV-2 RNA is generally detectable in upper respiratory specimens during the acute phase of infection. The lowest concentration of SARS-CoV-2 viral copies this assay can detect is 138 copies/mL. A negative result does not preclude SARS-Cov-2 infection and should not be used as the sole basis for treatment or other patient management decisions. A negative result may occur with  improper specimen collection/handling, submission of specimen other than nasopharyngeal swab, presence of viral mutation(s) within the areas targeted by this assay, and inadequate number of viral copies(<138 copies/mL). A negative result must be combined with clinical observations, patient history, and epidemiological information. The expected result is Negative.  Fact Sheet for Patients:  BloggerCourse.com  Fact Sheet for Healthcare Providers:  SeriousBroker.it  This test is no t yet approved or cleared by the [place]  FDA and  has been authorized for detection and/or diagnosis of SARS-CoV-2 by FDA under an Emergency Use Authorization (EUA). This EUA will remain  in effect (meaning this test can be used) for the duration of the COVID-19 declaration under Section 564(b)(1) of the Act, 21 U.S.C.section 360bbb-3(b)(1), unless the authorization is terminated  or revoked sooner.       Influenza A by PCR NEGATIVE NEGATIVE Final   Influenza B by PCR NEGATIVE NEGATIVE Final    Comment: (  NOTE) The Xpert Xpress SARS-CoV-2/FLU/RSV plus assay is intended as an aid in the diagnosis of influenza from Nasopharyngeal swab specimens and should not be used as a sole basis for treatment. Nasal washings and aspirates are unacceptable for Xpert Xpress SARS-CoV-2/FLU/RSV testing.  Fact Sheet for Patients: BloggerCourse.com  Fact Sheet for Healthcare  Providers: SeriousBroker.it  This test is not yet approved or cleared by the [place]  FDA and has been authorized for detection and/or diagnosis of SARS-CoV-2 by FDA under an Emergency Use Authorization (EUA). This EUA will remain in effect (meaning this test can be used) for the duration of the COVID-19 declaration under Section 564(b)(1) of the Act, 21 U.S.C. section 360bbb-3(b)(1), unless the authorization is terminated or revoked.     Resp Syncytial Virus by PCR NEGATIVE NEGATIVE Final    Comment: (NOTE) Fact Sheet for Patients: BloggerCourse.com  Fact Sheet for Healthcare Providers: SeriousBroker.it  This test is not yet approved or cleared by the [place]  FDA and has been authorized for detection and/or diagnosis of SARS-CoV-2 by FDA under an Emergency Use Authorization (EUA). This EUA will remain in effect (meaning this test can be used) for the duration of the COVID-19 declaration under Section 564(b)(1) of the Act, 21 U.S.C. section 360bbb-3(b)(1), unless the authorization is terminated or revoked.  Performed at [facility], [address] [place], [place] [address]   Respiratory (~20 pathogens) panel by PCR     Status: None   Collection Time: 01/26/23  3:16 PM   Specimen: Nasopharyngeal Swab; Respiratory  Result Value Ref Range Status   Adenovirus NOT DETECTED NOT DETECTED Final   Coronavirus 229E NOT DETECTED NOT DETECTED Final    Comment: (NOTE) The Coronavirus on the Respiratory Panel, DOES NOT test for the novel  Coronavirus (2019 nCoV)    Coronavirus HKU1 NOT DETECTED NOT DETECTED Final   Coronavirus NL63 NOT DETECTED NOT DETECTED Final   Coronavirus OC43 NOT DETECTED NOT DETECTED Final   Metapneumovirus NOT DETECTED NOT DETECTED Final   Rhinovirus / Enterovirus NOT DETECTED NOT DETECTED Final   Influenza A NOT DETECTED NOT DETECTED Final   Influenza B NOT  DETECTED NOT DETECTED Final   Parainfluenza Virus 1 NOT DETECTED NOT DETECTED Final   Parainfluenza Virus 2 NOT DETECTED NOT DETECTED Final   Parainfluenza Virus 3 NOT DETECTED NOT DETECTED Final   Parainfluenza Virus 4 NOT DETECTED NOT DETECTED Final   Respiratory Syncytial Virus NOT DETECTED NOT DETECTED Final   Bordetella pertussis NOT DETECTED NOT DETECTED Final   Bordetella Parapertussis NOT DETECTED NOT DETECTED Final   Chlamydophila pneumoniae NOT DETECTED NOT DETECTED Final   Mycoplasma pneumoniae NOT DETECTED NOT DETECTED Final    Comment: Performed at [facility], [address], [place], [place] [address]    Labs: CBC: Recent Labs  Lab 07/17/24 1245  WBC 7.2  HGB 14.4  HCT 40.5  MCV 88.6  PLT 224   Basic Metabolic Panel: Recent Labs  Lab 07/17/24 1245 07/17/24 2303 07/18/24 0528 07/18/24 1352  NA 132* 137 136  --   K 3.1* 2.8* 3.3* 3.9  CL 97* 103 105  --   CO2 19* 22 20*  --   GLUCOSE 691* 217* 218*  --   BUN 15 12 11   --   CREATININE 0.82 0.51 0.60  --   CALCIUM  9.5 8.9 8.6*  --   MG  --  1.5* 2.1  --   PHOS  --   --  1.7*  --  Liver Function Tests: No results for input(s): AST, ALT, ALKPHOS, BILITOT, PROT, ALBUMIN in the last 168 hours. CBG: Recent Labs  Lab 07/18/24 0200 07/18/24 0254 07/18/24 0357 07/18/24 0754 07/18/24 1150  GLUCAP 228* 221* 224* 232* 376*    Discharge time spent: greater than 30 minutes.  Signed: Murvin Mana, MD [facility] 07/18/2024

## 2024-07-18 NOTE — ED Notes (Signed)
Diabetes coordinator at bedside. 

## 2024-07-18 NOTE — ED Notes (Signed)
 Pt and her son were educated by this RN and Heidy, RN about how to check her blood sugar, how to administer insulin , her new medications, s/sx of hypoglycemia, importance of eating before short-acting insulin , and following up with PCP and diabetes dietary education

## 2024-07-18 NOTE — Inpatient Diabetes Management (Signed)
 Inpatient Diabetes Program Recommendations  AACE/ADA: New Consensus Statement on Inpatient Glycemic Control (2015)  Target Ranges:  Prepandial:   less than 140 mg/dL      Peak postprandial:   less than 180 mg/dL (1-2 hours)      Critically ill patients:  140 - 180 mg/dL   Lab Results  Component Value Date   GLUCAP 232 (H) 07/18/2024   HGBA1C 6.0 (H) 01/27/2023    Review of Glycemic Control  Latest Reference Range & Units 07/18/24 00:56 07/18/24 02:00 07/18/24 02:54 07/18/24 03:57 07/18/24 07:54  Glucose-Capillary 70 - 99 mg/dL 801 (H) 771 (H) 778 (H) 224 (H) 232 (H)   Diabetes history: DM -NEW DIAGNOSIS Outpatient Diabetes medications:  None Current orders for Inpatient glycemic control:  Novolog  0-9 units q 4 hours Semglee  12 units daily  Inpatient Diabetes Program Recommendations:    Note new diagnosis of DM.  Will see patient this afternoon.  A1C pending.   Thanks,  Randall Bullocks, RN, BC-ADM Inpatient Diabetes Coordinator Pager [phone]  (8a-5p)

## 2024-07-19 LAB — HEMOGLOBIN A1C
Hgb A1c MFr Bld: >15.5 % — ABNORMAL HIGH (ref 4.8–5.6)
Mean Plasma Glucose: >398 mg/dL

## 2024-07-21 LAB — BLOOD GAS, VENOUS
Acid-base deficit: 2.9 mmol/L — ABNORMAL HIGH (ref 0.0–2.0)
Bicarbonate: 22.6 mmol/L (ref 20.0–28.0)
O2 Saturation: 53.1 %
Patient temperature: 37
pCO2, Ven: 41 mmHg — ABNORMAL LOW (ref 44–60)
pH, Ven: 7.35 (ref 7.25–7.43)

## 2024-07-24 ENCOUNTER — Encounter: Payer: Self-pay | Admitting: Pharmacist

## 2024-07-24 ENCOUNTER — Other Ambulatory Visit: Payer: Self-pay | Admitting: Pharmacist

## 2024-07-24 NOTE — Progress Notes (Signed)
 07/24/2024 Name: Evana Runnels MRN: [id number] DOB: 06/17/1956  No chief complaint on file.   Kaeley Baldridge is a 68 y.o. year old female who presented for a telephone visit.   They were referred to the pharmacist by inpatient diabetes educators  for assistance in managing diabetes.    Subjective:  Care Team: Primary Care Provider: [facility] ;  Medication Access/Adherence  Current Pharmacy:  [facility] - [place], [place] - [address] [place] [address] Phone: [phone] Fax: [phone]  [facility] [address]), [place] - [address] [place]) [place] [address] Phone: [phone] Fax: [phone]  [facility] [address] [place] [address] Phone: [phone] Fax: [phone]   Patient reports affordability concerns with their medications: No  Patient reports access/transportation concerns to their pharmacy: No  Patient reports adherence concerns with their medications:  No     Diabetes:  Current medications: metformin 500 mg twice daily, Lantus  25 units daily, Humalog  6 units three times daily   Recent ED visit for hyperglycemia. Reports she was started on insulin  and was very nervous, but is feeling better now.   Reports she has a glucometer, but has not been checking often. Spoke with her PCP yesterday about an order for continuous glucose monitoring, but has not seen a prescription at her pharmacy yet.  Current meal patterns: Three days a week of meals coming from insurance coming soon - Discussed cutting back on fried foods, increasing vegetables and minimizing carbohydrate portion sizes.  - Drinks; trying to drink more water ; zero sugar drinks;   Hypertension in the setting of CAD  Current medications: amlodipine  10 mg daily, atenolol  100 mg daily, valsartan 320 mg daily  - reports  she is interested in consolidating therapy regimen  Patient does not have a validated, automated, upper arm home BP cuff - reports she had one but it is broken   Reports wanting to focus on reducing salt intake  Lipid Management in the setting of CAD: Current medications: atorvastatin  40 mg daily  History of aortic atherosclerosis on imaging.   History of Tobacco Abuse: Reports she quit smoking about a month ago. Had been taking a puff every now and then, but stopped.   Objective:  Lab Results  Component Value Date   HGBA1C >15.5 (H) 07/18/2024    Lab Results  Component Value Date   CREATININE 0.60 07/18/2024   BUN 11 07/18/2024   NA 136 07/18/2024   K 3.9 07/18/2024   CL 105 07/18/2024   CO2 20 (L) 07/18/2024    No results for lipids available in [facility]   Assessment/Plan:   Diabetes: - Currently uncontrolled. Patient confirms medication access and PCP follow up scheduled.  - Reviewed long term cardiovascular and renal outcomes of uncontrolled blood sugar - Reviewed dietary modifications including: discussed Plate Method - Recommend to discuss order for CGM with PCP.  - Recommend to check glucose using CGM if able to access, otherwise using glucometer.   Hypertension: - Currently uncontrolled per last available blood pressure readings. Patient sees Cardiology tomorrow.  - Reviewed long term cardiovascular and renal outcomes of uncontrolled blood pressure - Encouraged to contact insurance to see if Over the Counter benefits for blood pressure machine.  - Recommend to discuss opportunities to optimize hypertension regimen with Dr. Darliss tomorrow.   Lipid Management and ASCVD Risk Reduction - Currently unknown control.  Encouraged to discuss with Cardiology and Primary Care.  - Recommend to continue atorvastatin  - Praised for tobacco cessation  Follow Up Plan: encouraged patient to follow up with PCP for diabetes management as scheduled  [facility], PharmD,  [facility] Clinical Pharmacist [phone]

## 2024-07-25 ENCOUNTER — Ambulatory Visit: Attending: Cardiology | Admitting: Cardiology

## 2024-07-25 ENCOUNTER — Encounter: Payer: Self-pay | Admitting: Cardiology

## 2024-07-25 VITALS — BP 128/62 | HR 68 | Ht 65.0 in | Wt 131.4 lb

## 2024-07-25 DIAGNOSIS — R0602 Shortness of breath: Secondary | ICD-10-CM

## 2024-07-25 DIAGNOSIS — I2510 Atherosclerotic heart disease of native coronary artery without angina pectoris: Secondary | ICD-10-CM

## 2024-07-25 DIAGNOSIS — I10 Essential (primary) hypertension: Secondary | ICD-10-CM

## 2024-07-25 NOTE — Progress Notes (Signed)
 Cardiology Office Note:    Date:  07/25/2024   ID:  Judy Martin, DOB [birth year], MRN [id number]  PCP:  [facility]   [facility] Providers Cardiologist:  None     Referring MD: [facility]   Chief Complaint  Patient presents with   Follow-up    3 month follow up pt has been doing well with no complaints of chest pain, chest pressure or SOB, medciation reviewed verbally with patient    History of Present Illness:    Judy Martin is a 68 y.o. female with a hx of CAD (LAD, RCA calcifications on chest CT 12/2023 ), hypertension, hyperlipidemia, former smoker, COPD who presents for follow-up.  Patient has a chest CT for lung cancer screening 01/10/2024 showing LAD, RCA calcification, aortic atherosclerosis.  Denies chest pain, shortness of breath is better.  Endorse quitting smoking.  Last A1c significantly elevated, endorses drinking sugary drinks.  Echocardiogram 12/2022 EF 60 to 65%, impaired relaxation.   Past Medical History:  Diagnosis Date   Hypercholesteremia    Hypertension     Past Surgical History:  Procedure Laterality Date   ABDOMINAL HYSTERECTOMY     COLONOSCOPY WITH PROPOFOL  N/A 11/14/2023   Procedure: COLONOSCOPY WITH PROPOFOL ;  Surgeon: Unk Corinn Skiff, MD;  Location: [facility] ENDOSCOPY;  Service: Gastroenterology;  Laterality: N/A;    Current Medications: Current Meds  Medication Sig   Accu-Chek Softclix Lancets lancets 1 each 3 (three) times daily. Use as directed to check blood sugar. May dispense any manufacturer covered by patient's insurance and fits patient's device.   amLODipine  (NORVASC ) 10 MG tablet Take 10 mg by mouth at bedtime.   ANORO ELLIPTA  62.5-25 MCG/ACT AEPB INHALE ONE PUFF BY MOUTH ONCE DAILY   aspirin  EC 81 MG tablet Take 81 mg by mouth daily.   atenolol  (TENORMIN ) 100 MG tablet Take 100 mg by mouth daily.   atorvastatin  (LIPITOR) 80 MG tablet Take 80 mg by mouth at bedtime.   Blood  Glucose Monitoring Suppl (BLOOD GLUCOSE MONITOR SYSTEM) w/Device KIT 1 each by Does not apply route 3 (three) times daily. May dispense any manufacturer covered by patient's insurance.   Glucose Blood (BLOOD GLUCOSE TEST STRIPS) STRP 1 each by Does not apply route 3 (three) times daily. Use as directed to check blood sugar. May dispense any manufacturer covered by patient's insurance and fits patient's device.   insulin  glargine (LANTUS  SOLOSTAR) 100 UNIT/ML Solostar Pen Inject 25 Units into the skin daily. May substitute as needed per insurance.   insulin  lispro (HUMALOG  KWIKPEN) 100 UNIT/ML KwikPen Inject 6 Units into the skin 3 (three) times daily.   Insulin  Pen Needle (PEN NEEDLES) 31G X 8 MM MISC 1 each by Does not apply route 3 (three) times daily. May dispense any manufacturer covered by patient's insurance.   Lancet Device MISC 1 each by Does not apply route 3 (three) times daily. May dispense any manufacturer covered by patient's insurance.   metFORMIN (GLUCOPHAGE) 500 MG tablet Take 500 mg by mouth 2 (two) times daily.   potassium chloride  (KLOR-CON  M) 10 MEQ tablet Take 10 mEq by mouth daily.   valsartan (DIOVAN) 320 MG tablet Take 320 mg by mouth daily.   Vitamin D, Ergocalciferol, (DRISDOL) 1.25 MG (50000 UNIT) CAPS capsule Take 50,000 Units by mouth every 7 (seven) days.     Allergies:   Patient has no known allergies.   Social History   Socioeconomic History   Marital status: Divorced  Spouse name: Not on file   Number of children: Not on file   Years of education: Not on file   Highest education level: Not on file  Occupational History   Not on file  Tobacco Use   Smoking status: Former    Current packs/day: 0.50    Average packs/day: 0.5 packs/day for 25.0 years (12.5 ttl pk-yrs)    Types: Cigarettes   Smokeless tobacco: Not on file  Vaping Use   Vaping status: Never Used  Substance and Sexual Activity   Alcohol use: Yes   Drug use: Never   Sexual activity: Not  Currently  Other Topics Concern   Not on file  Social History Narrative   Not on file   Social Drivers of Health   Financial Resource Strain: Not on file  Food Insecurity: No Food Insecurity (01/26/2023)   Hunger Vital Sign    Worried About Running Out of Food in the Last Year: Never true    Ran Out of Food in the Last Year: Never true  Transportation Needs: No Transportation Needs (01/26/2023)   PRAPARE - Administrator, Civil Service (Medical): No    Lack of Transportation (Non-Medical): No  Physical Activity: Not on file  Stress: Not on file  Social Connections: Not on file     Family History: The patient's family history includes Diabetes in her mother; Heart attack in her mother; Stroke in her brother.  ROS:   Please see the history of present illness.     All other systems reviewed and are negative.  EKGs/Labs/Other Studies Reviewed:    The following studies were reviewed today:       Recent Labs: 07/17/2024: Hemoglobin 14.4; Platelets 224 07/18/2024: BUN 11; Creatinine, Ser 0.60; Magnesium  2.1; Potassium 3.9; Sodium 136  Recent Lipid Panel No results found for: CHOL, TRIG, HDL, CHOLHDL, VLDL, LDLCALC, LDLDIRECT   Risk Assessment/Calculations:         Physical Exam:    VS:  BP 128/62 (BP Location: Left Arm, Patient Position: Sitting, Cuff Size: Normal)   Pulse 68   Ht 5' 5 (1.651 m)   Wt 131 lb 6.4 oz (59.6 kg)   SpO2 96%   BMI 21.87 kg/m     Wt Readings from Last 3 Encounters:  07/25/24 131 lb 6.4 oz (59.6 kg)  07/17/24 120 lb (54.4 kg)  04/25/24 133 lb 12.8 oz (60.7 kg)     GEN:  Well nourished, well developed in no acute distress HEENT: Normal NECK: No JVD; No carotid bruits CARDIAC: RRR, no murmurs, rubs, gallops RESPIRATORY: Diminished breath sounds, no wheezing ABDOMEN: Soft, non-tender, non-distended MUSCULOSKELETAL:  No edema; No deformity  SKIN: Warm and dry NEUROLOGIC:  Alert and oriented x 3 PSYCHIATRIC:   Normal affect   ASSESSMENT:    1. Coronary artery disease involving native coronary artery of native heart, unspecified whether angina present   2. Primary hypertension   3. Shortness of breath    PLAN:    In order of problems listed above:  CAD, LAD, RCA calcifications on chest CT.  Aortic atherosclerosis noted.  Denies chest pain.  Echo 12/2022 with normal EF 60 to 65%.  Continue aspirin , Lipitor 80 mg daily.  Repeat panel recommended, but patient declined stating she plans to obtain lipid panel with PCP. Hypertension, BP  controlled.  Low-salt diet advised.  Continue Norvasc  10 mg daily, atenolol  100 mg daily, valsartan 320 mg daily. Shortness of breath, likely from COPD with PFT showing  obstructive disease.  Denies chest pain.  Congratulated on stopping smoking.  Follow-up in 6 months     Medication Adjustments/Labs and Tests Ordered: Current medicines are reviewed at length with the patient today.  Concerns regarding medicines are outlined above.  No orders of the defined types were placed in this encounter.  No orders of the defined types were placed in this encounter.   Patient Instructions  Medication Instructions:  Your physician recommends that you continue on your current medications as directed. Please refer to the Current Medication list given to you today.   *If you need a refill on your cardiac medications before your next appointment, please call your pharmacy*  Lab Work: No labs ordered today  If you have labs (blood work) drawn today and your tests are completely normal, you will receive your results only by: MyChart Message (if you have MyChart) OR A paper copy in the mail If you have any lab test that is abnormal or we need to change your treatment, we will call you to review the results.  Testing/Procedures: No test ordered today   Follow-Up: At [facility], you and your health needs are our priority.  As part of our continuing mission to  provide you with exceptional heart care, our providers are all part of one team.  This team includes your primary Cardiologist (physician) and Advanced Practice Providers or APPs (Physician Assistants and Nurse Practitioners) who all work together to provide you with the care you need, when you need it.  Your next appointment:   1 year(s)  Provider:   You may see Dr. Darliss or one of the following Advanced Practice Providers on your designated Care Team:   Lonni Meager, NP Lesley Maffucci, PA-C Bernardino Bring, PA-C Cadence [place], PA-C Tylene Lunch, NP Barnie Hila, NP    We recommend signing up for the patient portal called MyChart.  Sign up information is provided on this After Visit Summary.  MyChart is used to connect with patients for Virtual Visits (Telemedicine).  Patients are able to view lab/test results, encounter notes, upcoming appointments, etc.  Non-urgent messages can be sent to your provider as well.   To learn more about what you can do with MyChart, go to [email or web address].     Signed, Redell Darliss, MD  07/25/2024 11:04 AM    [facility]

## 2024-07-25 NOTE — Patient Instructions (Signed)
 Medication Instructions:  Your physician recommends that you continue on your current medications as directed. Please refer to the Current Medication list given to you today.   *If you need a refill on your cardiac medications before your next appointment, please call your pharmacy*  Lab Work: No labs ordered today  If you have labs (blood work) drawn today and your tests are completely normal, you will receive your results only by: MyChart Message (if you have MyChart) OR A paper copy in the mail If you have any lab test that is abnormal or we need to change your treatment, we will call you to review the results.  Testing/Procedures: No test ordered today   Follow-Up: At [facility], you and your health needs are our priority.  As part of our continuing mission to provide you with exceptional heart care, our providers are all part of one team.  This team includes your primary Cardiologist (physician) and Advanced Practice Providers or APPs (Physician Assistants and Nurse Practitioners) who all work together to provide you with the care you need, when you need it.  Your next appointment:   1 year(s)  Provider:   You may see Dr. Junnie Olives or one of the following Advanced Practice Providers on your designated Care Team:   Laneta Pintos, NP Gildardo Labrador, PA-C Varney Gentleman, PA-C Cadence Navarre, PA-C Ronald Cockayne, NP Morey Ar, NP    We recommend signing up for the patient portal called "MyChart".  Sign up information is provided on this After Visit Summary.  MyChart is used to connect with patients for Virtual Visits (Telemedicine).  Patients are able to view lab/test results, encounter notes, upcoming appointments, etc.  Non-urgent messages can be sent to your provider as well.   To learn more about what you can do with MyChart, go to [email or web address].

## 2024-08-17 ENCOUNTER — Encounter: Payer: Self-pay | Admitting: *Deleted

## 2024-08-28 ENCOUNTER — Encounter: Payer: Self-pay | Admitting: Surgery

## 2024-08-28 ENCOUNTER — Ambulatory Visit: Admitting: Surgery

## 2024-08-28 VITALS — BP 145/72 | HR 69 | Temp 98.3°F | Ht 65.0 in | Wt 132.0 lb

## 2024-08-28 DIAGNOSIS — L723 Sebaceous cyst: Secondary | ICD-10-CM | POA: Diagnosis not present

## 2024-08-28 NOTE — Progress Notes (Signed)
 Patient ID: Judy Martin, female   DOB: [birth year], 68 y.o.   MRN: [id number]  Chief Complaint: Dermal cyst left ear  History of Present Illness Judy Martin is a 68 y.o. female with a dermal cyst of the left ear, present proximately 5 years.  Denies spontaneous drainage, however she admits to squeezing stuff out of it .  No history is of fevers or chills, denies it being any pain at all.  She reports she is getting her blood sugars under control by turning away from the sweet wine.  She currently takes aspirin .  Past Medical History Past Medical History:  Diagnosis Date   Hypercholesteremia    Hypertension       Past Surgical History:  Procedure Laterality Date   ABDOMINAL HYSTERECTOMY     COLONOSCOPY WITH PROPOFOL  N/A 11/14/2023   Procedure: COLONOSCOPY WITH PROPOFOL ;  Surgeon: Unk Corinn Skiff, MD;  Location: [facility] ENDOSCOPY;  Service: Gastroenterology;  Laterality: N/A;    No Known Allergies  Current Outpatient Medications  Medication Sig Dispense Refill   Accu-Chek Softclix Lancets lancets 1 each 3 (three) times daily. Use as directed to check blood sugar. May dispense any manufacturer covered by patient's insurance and fits patient's device. 100 each 0   amLODipine  (NORVASC ) 10 MG tablet Take 10 mg by mouth at bedtime.     ANORO ELLIPTA  62.5-25 MCG/ACT AEPB INHALE ONE PUFF BY MOUTH ONCE DAILY 60 each 2   aspirin  EC 81 MG tablet Take 81 mg by mouth daily.     atenolol  (TENORMIN ) 100 MG tablet Take 100 mg by mouth daily.     atorvastatin  (LIPITOR) 80 MG tablet Take 80 mg by mouth at bedtime.     Blood Glucose Monitoring Suppl (BLOOD GLUCOSE MONITOR SYSTEM) w/Device KIT 1 each by Does not apply route 3 (three) times daily. May dispense any manufacturer covered by patient's insurance. 1 kit 0   Glucose Blood (BLOOD GLUCOSE TEST STRIPS) STRP 1 each by Does not apply route 3 (three) times daily. Use as directed to check blood sugar. May dispense any manufacturer covered by  patient's insurance and fits patient's device. 100 strip 0   insulin  glargine (LANTUS  SOLOSTAR) 100 UNIT/ML Solostar Pen Inject 25 Units into the skin daily. May substitute as needed per insurance. 15 mL 0   insulin  lispro (HUMALOG  KWIKPEN) 100 UNIT/ML KwikPen Inject 6 Units into the skin 3 (three) times daily. 15 mL 0   Insulin  Pen Needle (PEN NEEDLES) 31G X 8 MM MISC 1 each by Does not apply route 3 (three) times daily. May dispense any manufacturer covered by patient's insurance. 100 each 0   Lancet Device MISC 1 each by Does not apply route 3 (three) times daily. May dispense any manufacturer covered by patient's insurance. 1 each 0   metFORMIN (GLUCOPHAGE) 500 MG tablet Take 500 mg by mouth 2 (two) times daily.     potassium chloride  (KLOR-CON  M) 10 MEQ tablet Take 10 mEq by mouth daily.     valsartan (DIOVAN) 320 MG tablet Take 320 mg by mouth daily.     Vitamin D, Ergocalciferol, (DRISDOL) 1.25 MG (50000 UNIT) CAPS capsule Take 50,000 Units by mouth every 7 (seven) days.     No current facility-administered medications for this visit.    Family History Family History  Problem Relation Age of Onset   Diabetes Mother    Heart attack Mother    Stroke Brother       Social History Social History   Tobacco  Use   Smoking status: Former    Current packs/day: 0.50    Average packs/day: 0.5 packs/day for 25.0 years (12.5 ttl pk-yrs)    Types: Cigarettes  Vaping Use   Vaping status: Never Used  Substance Use Topics   Alcohol use: Yes   Drug use: Never        Review of Systems  Constitutional: Negative.   HENT: Negative.    Eyes: Negative.   Respiratory: Negative.    Cardiovascular: Negative.   Gastrointestinal: Negative.   Genitourinary: Negative.   Skin: Negative.   Neurological: Negative.   Psychiatric/Behavioral: Negative.       Physical Exam Blood pressure (!) 145/72, pulse 69, temperature 98.3 F (36.8 C), temperature source Oral, height 5' 5 (1.651 m), weight  132 lb (59.9 kg), SpO2 94%. Last Weight  Most recent update: 08/28/2024  9:00 AM    Weight  59.9 kg (132 lb)             CONSTITUTIONAL: Well developed, and nourished, appropriately responsive and aware without distress.   EYES: Sclera non-icteric.   EARS, NOSE, MOUTH AND THROAT:  The oropharynx is clear. Oral mucosa is pink and moist.    Hearing is intact to voice.  There is a 1 cm sized superficial dermal cyst of the anterior left earlobe close to the tragus, but without apparent involvement of the underlying cartilage.  There is a black punctum overlying it consistent with comedone/sebaceous cyst type of cyst.  No surrounding erythema or induration. NECK: Trachea is midline, and there is no jugular venous distension.  LYMPH NODES:  Lymph nodes in the neck are not appreciated. RESPIRATORY:  Normal respiratory effort without pathologic use of accessory muscles. CARDIOVASCULAR:  Well perfused.  GI: The abdomen is  soft, nontender, and nondistended. There were no palpable masses. MUSCULOSKELETAL:  Symmetrical muscle tone appreciated in all four extremities.    SKIN: Skin turgor is normal. No pathologic skin lesions appreciated.  NEUROLOGIC:  Motor and sensation appear grossly normal.  Cranial nerves are grossly without defect. PSYCH:  Alert and oriented to person, place and time. Affect is appropriate for situation.  Data Reviewed I have personally reviewed what is currently available of the patient's imaging, recent labs and medical records.   Labs:     Latest Ref Rng & Units 07/17/2024   12:45 PM 01/27/2023    4:44 AM 01/26/2023   12:44 PM  CBC  WBC 4.0 - 10.5 K/uL 7.2  9.1  7.6   Hemoglobin 12.0 - 15.0 g/dL 85.5  85.4  84.6   Hematocrit 36.0 - 46.0 % 40.5  42.3  45.3   Platelets 150 - 400 K/uL 224  322  329       Latest Ref Rng & Units 07/18/2024    1:52 PM 07/18/2024    5:28 AM 07/17/2024   11:03 PM  CMP  Glucose 70 - 99 mg/dL  781  782   BUN 8 - 23 mg/dL  11  12   Creatinine  9.55 - 1.00 mg/dL  9.39  9.48   Sodium 864 - 145 mmol/L  136  137   Potassium 3.5 - 5.1 mmol/L 3.9  3.3  2.8   Chloride 98 - 111 mmol/L  105  103   CO2 22 - 32 mmol/L  20  22   Calcium  8.9 - 10.3 mg/dL  8.6  8.9       Imaging: Radiological images personally reviewed:   Within last 24 hrs:  No results found.  Assessment    Sebaceous cyst left earlobe.   Patient Active Problem List   Diagnosis Date Noted   Hyperglycemic hyperosmolar nonketotic coma (HCC) 07/18/2024   Hyperosmolar hyperglycemic state (HHS) (HCC) 07/17/2024   Pre-diabetes 07/17/2024   Alcohol use 07/17/2024   Encounter for screening colonoscopy 11/14/2023   Rectal polyp 11/14/2023   Polyp of transverse colon 11/14/2023   Polyp of descending colon 11/14/2023   Bronchiectasis (HCC) 01/27/2023   Acute hypoxic respiratory failure (HCC) 01/26/2023   Hypokalemia 01/26/2023   Polycythemia 01/26/2023   Hypercholesteremia    Essential hypertension 09/26/2014   Palpitations 09/26/2014    Plan    Excision under local anesthesia, will hold her baby aspirin  for the next week.  Advised that she must maintain good blood sugar control.  Risks include but are not limited to local anesthesia, bleeding, infection, hematoma, keloid formation, scar formation, poor cosmesis.  I believe she desires to proceed, accepts the risks and defatting her questions adequately answered.  No guarantees ever expressed or implied.    I personally spent a total of 30 minutes in the care of the patient today including preparing to see the patient, getting/reviewing separately obtained history, performing a medically appropriate exam/evaluation, and documenting clinical information in the EHR.   These notes generated with voice recognition software. I apologize for typographical errors.  Honor Leghorn M.D., FACS 08/28/2024, 9:17 AM

## 2024-08-28 NOTE — Patient Instructions (Signed)
 Pocket of Fluid in the Skin (Epidermoid Cyst): What to Know  An epidermoid cyst is a pocket of fluid that can form under your skin. It's filled with thick, oily substance that your skin glands make. These cysts occur anywhere on your body. They're usually harmless and don't cause problems unless they get inflamed or infected. What are the causes? An epidermoid cyst may be caused by: A blocked pore or hair follicle. An ingrown hair. This is a hair that curls and re-enters the skin instead of growing straight out of the skin. Skin irritation. Skin injuries. Some conditions that are passed from parent to child (inherited). Human papillomavirus (HPV). This is rare but can cause cysts on the bottom of the feet. Long-term (chronic) sun damage to the skin. What increases the risk? Having acne. Being female. Skin injuries. Being past puberty. Certain rare genetic disorders. What are the signs or symptoms? The only sign of this type of cyst may be a small, painless lump under the skin. When an epidermal cyst ruptures, it may become inflamed. Infections are rare, but symptoms may include: Redness. Inflammation. Tenderness. Warmth. Fever. A bad-smelling, grayish-white substance draining from the cyst. Pus draining from the cyst. How is this diagnosed? This condition is diagnosed with a physical exam. Sometimes, a tissue sample (biopsy) may need to be looked at under a microscope or tested for bacteria. You may be referred to a health care provider who specializes in skin care. This provider is called a dermatologist. How is this treated? If a cyst becomes inflamed, treatment may include: Opening and draining the cyst. Antibiotics. Steroid shots to lessen inflammation. Surgery to take out cysts that are large, painful, or could turn into cancer. Do not try to open or squeeze a cyst yourself. Follow these instructions at home: Medicines Take your medicines only as told. If you were given  antibiotics, take them as told. Do not stop taking them even if you start to feel better. General instructions Keep the area around your cyst clean and dry. Wear loose, dry clothing. Avoid touching your cyst. Check the area around your cyst every day for signs of infection. Check for: Redness, swelling, or pain. Fluid or blood. Warmth. Pus or a bad smell. Keep all follow-up visits to make sure the cyst isn't becoming uncomfortable or infected. Contact a health care provider if: You have any signs of infection. Your cyst doesn't get better or gets worse. You get a cyst that looks different from other cysts you've had. You have a fever. You have redness that spreads from the cyst. This information is not intended to replace advice given to you by your health care provider. Make sure you discuss any questions you have with your health care provider. Document Revised: 08/08/2023 Document Reviewed: 07/29/2023 Elsevier Patient Education  2024 [employer].

## 2024-08-30 ENCOUNTER — Encounter: Attending: Internal Medicine | Admitting: Dietician

## 2024-08-30 ENCOUNTER — Encounter: Payer: Self-pay | Admitting: Dietician

## 2024-08-30 DIAGNOSIS — Z713 Dietary counseling and surveillance: Secondary | ICD-10-CM | POA: Diagnosis not present

## 2024-08-30 DIAGNOSIS — E1165 Type 2 diabetes mellitus with hyperglycemia: Secondary | ICD-10-CM

## 2024-08-30 DIAGNOSIS — E1100 Type 2 diabetes mellitus with hyperosmolarity without nonketotic hyperglycemic-hyperosmolar coma (NKHHC): Secondary | ICD-10-CM | POA: Diagnosis present

## 2024-08-30 NOTE — Progress Notes (Signed)
 Diabetes Self-Management Education  Visit Type: First/Initial  Appt. Start Time: 1400 Appt. End Time: 1515  08/30/2024  Judy Martin, identified by name and date of birth, is a 68 y.o. female with a diagnosis of Diabetes: Type 2.   ASSESSMENT Pt reports taking Lantus  @25u  in the evening, Humalog  @6u  w/ meals,  Metformin @500  BID.  Pt reports difficulty getting Humalog  kwikpen to work, Orthoptist provided. Reports occasional hypoglycemia indicated on CGM, but no symptoms, not treating low glucose when it happens. 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 Pt reports baking food instead of frying now. Pt reports drinking Ensure twice a day after a meal to attempt to regain weight lost recently, states they really enjoy it.    Diabetes Self-Management Education - 08/30/24 1410       Visit Information   Visit Type First/Initial      Initial Visit   Diabetes Type Type 2    Date Diagnosed 2025    Are you currently following a meal plan? No    Are you taking your medications as prescribed? Yes      Health Coping   How would you rate your overall health? Good      Psychosocial Assessment   Patient Belief/Attitude about Diabetes Motivated to manage diabetes    What is the hardest part about your diabetes right now, causing you the most concern, or is the most worrisome to you about your diabetes?   Taking/obtaining medications;Checking blood sugar;Making  healty food and beverage choices    Self-care barriers None    Self-management support Doctor's office    Other persons present Patient    Patient Concerns Nutrition/Meal planning;Medication;Glycemic Control    Special Needs None    Preferred Learning Style Visual;Hands on    Learning Readiness Ready    How often do you need to have someone help you when you read instructions, pamphlets, or other written materials from your doctor or pharmacy? 1 - Never    What is the last grade level you completed in school? BS degree      Pre-Education Assessment   Patient understands the diabetes disease and treatment process. Needs Instruction    Patient understands incorporating nutritional management into lifestyle. Needs Instruction    Patient undertands incorporating physical activity into lifestyle. Needs Instruction    Patient understands using medications safely. Needs Instruction    Patient understands monitoring blood glucose, interpreting and using results Needs Instruction    Patient understands prevention, detection, and treatment of acute complications. Needs Instruction    Patient understands prevention, detection, and treatment of chronic complications. Needs Instruction    Patient understands how to develop strategies to address psychosocial issues. Needs Instruction    Patient understands how to develop strategies to promote  health/change behavior. Needs Instruction      Complications   Last HgB A1C per patient/outside source 15.5 %   07/18/2024   How often do you check your blood sugar? > 4 times/day    Fasting Blood glucose range (mg/dL) 29-870;869-820    Postprandial Blood glucose range (mg/dL) >799;819-799;869-820    Number of hypoglycemic episodes per month 0   CGM reports occaisonal lows, pt reports no hypoglycemic symptoms   Number of hyperglycemic episodes ( >200mg /dL): Daily    Can you tell when your blood sugar is high? No    Have you had a dilated eye exam in the past 12  months? Yes    Have you had a dental exam in the past 12 months? Yes    Are you checking your feet? Yes    How many days per week are you checking your feet? 3      Dietary Intake   Breakfast 1 baked wing, brussel sprouts, Butter pecan Ensure    Snack (morning) Ensure    Lunch 2 baked wings, brussel sprouts    Snack (afternoon) Ensure    Beverage(s) water       Activity / Exercise   Activity / Exercise Type ADL's    How many days per week do you exercise? 0    How many minutes per day do you exercise? 0    Total minutes per week of exercise 0      Patient Education   Previous Diabetes Education No    Disease Pathophysiology Explored patient's options for treatment of their diabetes;Factors that contribute to the development of diabetes    Healthy Eating Role of diet in the treatment of diabetes and the relationship between the three main macronutrients and blood glucose level    Being Active Identified with patient nutritional and/or medication changes necessary with exercise.    Medications Taught/reviewed insulin /injectables, injection, site rotation, insulin /injectables storage and needle disposal.;Reviewed patients medication for diabetes, action, purpose, timing of dose and side effects.    Monitoring Taught/evaluated CGM (comment)    Acute complications Taught prevention, symptoms, and  treatment of hypoglycemia - the 15 rule.;Discussed and identified patients' prevention, symptoms, and treatment of hyperglycemia.    Chronic complications Relationship between chronic complications and blood glucose control    Diabetes Stress and Support Identified and addressed patients feelings and concerns about diabetes;Worked with patient to identify barriers to care and solutions;Helped patient identify a support system for diabetes management    Lifestyle and Health Coping Lifestyle issues that need to be addressed for better diabetes care   ETOH cessation     Individualized Goals (developed  by patient)   Nutrition Follow meal plan discussed    Physical Activity 15 minutes per day    Medications take my medication as prescribed    Monitoring  Consistenly use CGM    Problem Solving Medication consistency;Eating Pattern;Addressing barriers to behavior change   ETOH Cessation   Reducing Risk examine blood glucose patterns;treat hypoglycemia with 15 grams of carbs if blood glucose less than 70mg /dL      Post-Education Assessment   Patient understands the diabetes disease and treatment process. Needs Review    Patient understands incorporating nutritional management into lifestyle. Needs Review    Patient undertands incorporating physical activity into lifestyle. Needs Review    Patient understands using medications safely. Comphrehends key points    Patient understands monitoring blood glucose, interpreting and using results Needs Review    Patient understands prevention, detection, and  treatment of acute complications. Comprehends key points    Patient understands prevention, detection, and treatment of chronic complications. Needs Review    Patient understands how to develop strategies to address psychosocial issues. Needs Review    Patient understands how to develop strategies to promote health/change behavior. Needs Review      Outcomes   Expected Outcomes Demonstrated interest in learning but significant barriers to change    Future DMSE PRN    Program Status Not Completed          Individualized Plan for Diabetes Self-Management Training:   Learning Objective:  Patient will have a greater understanding of diabetes self-management. Patient education plan is to attend individual and/or group sessions per assessed needs and concerns.   Plan:   Patient Instructions  Take a short 10-15 minute walk about an hour after your breakfast meal. Keep a few hard candies with you for if your glucose goes below 70!  Try switching from Ensure to Premier Protein for a low carb  Protein shake!  Begin to build your meals using the proportions of the Balanced Plate. First, select your carb choice(s) for the meal. Make this 25% of your meal. Next, select your source of protein to pair with your carb choice(s). Make this another 25% of your meal. Finally, complete your meal with a variety of non-starchy vegetables. Make this the remaining 50% of your meal.  Check your blood sugar each morning before eating or drinking (fasting). Look for numbers under 130 mg/dL Check your blood sugar 2 hours after you begin eating a meal. Look for numbers under 180 mg/dL at all times.  Your goal A1c is below 7.0%    Expected Outcomes:  Demonstrated interest in learning but significant barriers to change  Education material provided: ADA - How to Thrive : A Guide for Your Journey with Diabetes and My Plate, CGM Resources  If problems or questions, patient to contact team via:  Phone and Email  Future DSME appointment: PRN

## 2024-08-30 NOTE — Patient Instructions (Addendum)
 Take a short 10-15 minute walk about an hour after your breakfast meal. Keep a few hard candies with you for if your glucose goes below 70!  Try switching from Ensure to Premier Protein for a low carb Protein shake!  Begin to build your meals using the proportions of the Balanced Plate. First, select your carb choice(s) for the meal. Make this 25% of your meal. Next, select your source of protein to pair with your carb choice(s). Make this another 25% of your meal. Finally, complete your meal with a variety of non-starchy vegetables. Make this the remaining 50% of your meal.  Check your blood sugar each morning before eating or drinking (fasting). Look for numbers under 130 mg/dL Check your blood sugar 2 hours after you begin eating a meal. Look for numbers under 180 mg/dL at all times.  Your goal A1c is below 7.0%

## 2024-09-04 ENCOUNTER — Encounter: Payer: Self-pay | Admitting: Ophthalmology

## 2024-09-05 ENCOUNTER — Encounter: Payer: Self-pay | Admitting: Ophthalmology

## 2024-09-05 NOTE — Anesthesia Preprocedure Evaluation (Addendum)
 Anesthesia Evaluation  Patient identified by MRN, date of birth, ID band Patient awake    Reviewed: Allergy & Precautions, H&P , NPO status , Patient's Chart, lab work & pertinent test results  Airway Mallampati: III  TM Distance: >3 FB Neck ROM: Full    Dental no notable dental hx. (+) Upper Dentures, Lower Dentures   Pulmonary neg pulmonary ROS, shortness of breath, COPD, former smoker   Pulmonary exam normal breath sounds clear to auscultation       Cardiovascular hypertension, + CAD  negative cardio ROS Normal cardiovascular exam Rhythm:Regular Rate:Normal  07-25-24 echo  1. Left ventricular ejection fraction, by estimation, is 60 to 65%. The  left ventricle has normal function. The left ventricle has no regional  wall motion abnormalities. There is moderate left ventricular hypertrophy.  Left ventricular diastolic  parameters are consistent with Grade I diastolic dysfunction (impaired  relaxation).   2. Right ventricular systolic function is normal. The right ventricular  size is normal. Tricuspid regurgitation signal is inadequate for assessing  PA pressure.   3. Left atrial size was mildly dilated.   4. The mitral valve is normal in structure. Mild mitral valve  regurgitation. No evidence of mitral stenosis.   5. The aortic valve is normal in structure. Aortic valve regurgitation is  not visualized. Aortic valve sclerosis is present, with no evidence of  aortic valve stenosis.   6. The inferior vena cava is normal in size with greater than 50%  respiratory variability, suggesting right atrial pressure of 3 mmHg.     Neuro/Psych negative neurological ROS  negative psych ROS   GI/Hepatic negative GI ROS, Neg liver ROS,,,  Endo/Other  negative endocrine ROSdiabetes    Renal/GU negative Renal ROS  negative genitourinary   Musculoskeletal negative musculoskeletal ROS (+) Arthritis ,    Abdominal    Peds negative pediatric ROS (+)  Hematology negative hematology ROS (+)   Anesthesia Other Findings 07-17-24 in ER for hyperosmolar, hyperglycemic state with blood sugar 500,   blood sugar 110 today  Hypertension  Hypercholesteremia Dyspnea  COPD (chronic obstructive pulmonary disease)  Diabetes mellitus  Arthritis    Reproductive/Obstetrics negative OB ROS                              Anesthesia Physical Anesthesia Plan  ASA: 3  Anesthesia Plan: MAC   Post-op Pain Management:    Induction: Intravenous  PONV Risk Score and Plan:   Airway Management Planned: Natural Airway and Nasal Cannula  Additional Equipment:   Intra-op Plan:   Post-operative Plan:   Informed Consent: I have reviewed the patients History and Physical, chart, labs and discussed the procedure including the risks, benefits and alternatives for the proposed anesthesia with the patient or authorized representative who has indicated his/her understanding and acceptance.     Dental Advisory Given  Plan Discussed with: Anesthesiologist, CRNA and Surgeon  Anesthesia Plan Comments: (Patient consented for risks of anesthesia including but not limited to:  - adverse reactions to medications - damage to eyes, teeth, lips or other oral mucosa - nerve damage due to positioning  - sore throat or hoarseness - Damage to heart, brain, nerves, lungs, other parts of body or loss of life  Patient voiced understanding and assent.)         Anesthesia Quick Evaluation

## 2024-09-06 ENCOUNTER — Encounter: Payer: Self-pay | Admitting: Surgery

## 2024-09-06 ENCOUNTER — Ambulatory Visit (INDEPENDENT_AMBULATORY_CARE_PROVIDER_SITE_OTHER): Admitting: Surgery

## 2024-09-06 VITALS — BP 130/70 | HR 58 | Ht 65.0 in | Wt 134.0 lb

## 2024-09-06 DIAGNOSIS — L723 Sebaceous cyst: Secondary | ICD-10-CM

## 2024-09-06 NOTE — Discharge Instructions (Signed)

## 2024-09-06 NOTE — Patient Instructions (Addendum)
 We have removed a Cyst in our office today.  You have sutures that we will remove in one week.   You may use Ibuprofen or Tylenol  as needed for pain control. Use the ice pack 3-4 times a day for the next two days for any achiness.  You may shower tomorrow. Do not scrub at the area.   Avoid Strenuous activities that will make you sweat during the next 48 hours. Avoid activities that will place pressure to this area of the body for 1-2 weeks to avoid re-injury to incision site.  Please see your follow-up appointment provided. We will see you back in office to make sure this area is healed and to review the final pathology. If you have any questions or concerns prior to this appointment, call our office and speak with a nurse.    Excision of Skin Cysts or Lesions Excision of a skin lesion refers to the removal of a section of skin by making small cuts (incisions) in the skin. This procedure may be done to remove a cancerous (malignant) or noncancerous (benign) growth on the skin. It is typically done to treat or prevent cancer or infection. It may also be done to improve cosmetic appearance. The procedure may be done to remove: Cancerous growths, such as basal cell carcinoma, squamous cell carcinoma, or melanoma. Noncancerous growths, such as a cyst or lipoma. Growths, such as moles or skin tags, which may be removed for cosmetic reasons.  Various excision or surgical techniques may be used depending on your condition, the location of the lesion, and your overall health. Tell a health care provider about: Any allergies you have. All medicines you are taking, including vitamins, herbs, eye drops, creams, and over-the-counter medicines. Any problems you or family members have had with anesthetic medicines. Any blood disorders you have. Any surgeries you have had. Any medical conditions you have. Whether you are pregnant or may be pregnant. What are the risks? Generally, this is a safe  procedure. However, problems may occur, including: Bleeding. Infection. Scarring. Recurrence of the cyst, lipoma, or cancer. Changes in skin sensation or appearance, such as discoloration or swelling. Reaction to the anesthetics. Allergic reaction to surgical materials or ointments. Damage to nerves, blood vessels, muscles, or other structures. Continued pain.  What happens before the procedure? Ask your health care provider about: Changing or stopping your regular medicines. This is especially important if you are taking diabetes medicines or blood thinners. Taking medicines such as aspirin  and ibuprofen. These medicines can thin your blood. Do not take these medicines before your procedure if your health care provider instructs you not to. You may be asked to take certain medicines. You may be asked to stop smoking. You may have an exam or testing. What happens during the procedure? To reduce your risk of infection: Your health care team will wash or sanitize their hands. Your skin will be washed with soap. You will be given a medicine to numb the area (local anesthetic). One of the following excision techniques will be performed. At the end of any of these procedures, antibiotic ointment will be applied as needed. Each of the following techniques may vary among health care providers and hospitals. Complete Surgical Excision The area of skin that needs to be removed will be marked with a pen. Using a small scalpel or scissors, the surgeon will gently cut around and under the lesion until it is completely removed. The lesion will be placed in a fluid and sent to  the lab for examination. If necessary, bleeding will be controlled with a device that delivers heat (electrocautery). The edges of the wound may be stitched (sutured) together, and a bandage (dressing) or surgical glue will be applied. This procedure may be performed to treat a cancerous growth or a noncancerous cyst or  lesion.  What happens after the procedure? Return to your normal activities as told by your health care provider. Report any excessive bleeding, spreading redness, or increased pain.

## 2024-09-06 NOTE — Progress Notes (Signed)
 Excision of sebaceous cyst left earlobe, 1.3 cm diameter  Pre-operative Diagnosis: Sebaceous cyst left earlobe  Post-operative Diagnosis: same.   Surgeon: Honor Leghorn, M.D., FACS  Anesthesia: Local   Findings: Elongated cystic lesion much bigger within the earlobe than anticipated on the surface.  Estimated Blood Loss: 1 mL         Specimens: Dermal cyst with overlying dermis, consistent with sebaceous cyst/blackhead, completely removed and consistent with benign disease process.  Discarded not sent for permanent section.          Complications: none              Procedure Details  The patient was evaluated, the benefits, complications, treatment options, and expected outcomes were discussed with the patient. The risks of bleeding, infection, recurrence of symptoms, failure to resolve symptoms, unanticipated injury, any of which could require further surgery were reviewed with the patient. The likelihood of improving the patient's symptoms with return to their baseline status is expected.  The patient and/or family concurred with the proposed plan, giving informed consent.  The patient was taken to our procedure room, identified and the procedure verified.    The patient was positioned in the supine position and the left ear was prepped with  Chloraprep and draped in the sterile fashion.  A Time Out was held and the above information confirmed.  Local infiltration of 1% lidocaine  with epinephrine is completed to adequate anesthetic effect.  Elliptical incision is made to excise the central punctum.  Sharp dissection with scalpel to separate the skin and soft tissues from the adjacent cystic lesion.  Sebaceous cyst is removed intact with all its contents.  The incision was then closed with 2 simple sutures of 5-0 nylon.  Topical triple antibiotic applied.  Instructions given we will see her back in 5 days for suture removal.  The procedure was well-tolerated.

## 2024-09-06 NOTE — Anesthesia Preprocedure Evaluation (Addendum)
 Anesthesia Evaluation  Patient identified by MRN, date of birth, ID band Patient awake    Reviewed: Allergy & Precautions, H&P , NPO status , Patient's Chart, lab work & pertinent test results  Airway Mallampati: III  TM Distance: >3 FB Neck ROM: Full    Dental no notable dental hx. (+) Upper Dentures, Lower Dentures   Pulmonary neg pulmonary ROS, shortness of breath, COPD, former smoker   Pulmonary exam normal breath sounds clear to auscultation       Cardiovascular hypertension, + CAD  negative cardio ROS Normal cardiovascular exam Rhythm:Regular Rate:Normal  07-25-24 echo  1. Left ventricular ejection fraction, by estimation, is 60 to 65%. The  left ventricle has normal function. The left ventricle has no regional  wall motion abnormalities. There is moderate left ventricular hypertrophy.  Left ventricular diastolic  parameters are consistent with Grade I diastolic dysfunction (impaired  relaxation).   2. Right ventricular systolic function is normal. The right ventricular  size is normal. Tricuspid regurgitation signal is inadequate for assessing  PA pressure.   3. Left atrial size was mildly dilated.   4. The mitral valve is normal in structure. Mild mitral valve  regurgitation. No evidence of mitral stenosis.   5. The aortic valve is normal in structure. Aortic valve regurgitation is  not visualized. Aortic valve sclerosis is present, with no evidence of  aortic valve stenosis.   6. The inferior vena cava is normal in size with greater than 50%  respiratory variability, suggesting right atrial pressure of 3 mmHg.     Neuro/Psych negative neurological ROS  negative psych ROS   GI/Hepatic negative GI ROS, Neg liver ROS,,,  Endo/Other  negative endocrine ROSdiabetes    Renal/GU negative Renal ROS  negative genitourinary   Musculoskeletal negative musculoskeletal ROS (+) Arthritis ,    Abdominal    Peds negative pediatric ROS (+)  Hematology negative hematology ROS (+)   Anesthesia Other Findings Previous cataract surgery 09-10-24 Dr. Ola  Medical History  Hypertension  Hypercholesteremia Dyspnea  COPD (chronic obstructive pulmonary disease)  Diabetes mellitus without complication Arthritis Aortic atherosclerosis  CAD (coronary artery disease) Mild mitral regurgitation by prior echocardiogram  Grade I diastolic dysfunction CAD (coronary artery disease)     Reproductive/Obstetrics negative OB ROS                              Anesthesia Physical Anesthesia Plan  ASA: 3  Anesthesia Plan: MAC   Post-op Pain Management:    Induction: Intravenous  PONV Risk Score and Plan:   Airway Management Planned: Natural Airway and Nasal Cannula  Additional Equipment:   Intra-op Plan:   Post-operative Plan:   Informed Consent: I have reviewed the patients History and Physical, chart, labs and discussed the procedure including the risks, benefits and alternatives for the proposed anesthesia with the patient or authorized representative who has indicated his/her understanding and acceptance.     Dental Advisory Given  Plan Discussed with: Anesthesiologist, CRNA and Surgeon  Anesthesia Plan Comments: (Patient consented for risks of anesthesia including but not limited to:  - adverse reactions to medications - damage to eyes, teeth, lips or other oral mucosa - nerve damage due to positioning  - sore throat or hoarseness - Damage to heart, brain, nerves, lungs, other parts of body or loss of life  Patient voiced understanding and assent.)         Anesthesia Quick Evaluation

## 2024-09-10 ENCOUNTER — Encounter
Admission: RE | Disposition: A | Discharge status: Home / Self Care | Payer: Self-pay | Source: Home / Self Care | Attending: Ophthalmology

## 2024-09-10 ENCOUNTER — Ambulatory Visit: Payer: Self-pay | Admitting: Anesthesiology

## 2024-09-10 ENCOUNTER — Other Ambulatory Visit: Payer: Self-pay

## 2024-09-10 ENCOUNTER — Ambulatory Visit
Admission: RE | Admit: 2024-09-10 | Discharge: 2024-09-10 | Disposition: A | Discharge status: Home / Self Care | Attending: Ophthalmology | Admitting: Ophthalmology

## 2024-09-10 ENCOUNTER — Encounter: Payer: Self-pay | Admitting: Ophthalmology

## 2024-09-10 DIAGNOSIS — Z794 Long term (current) use of insulin: Secondary | ICD-10-CM | POA: Insufficient documentation

## 2024-09-10 DIAGNOSIS — Z8249 Family history of ischemic heart disease and other diseases of the circulatory system: Secondary | ICD-10-CM | POA: Insufficient documentation

## 2024-09-10 DIAGNOSIS — M1990 Unspecified osteoarthritis, unspecified site: Secondary | ICD-10-CM | POA: Insufficient documentation

## 2024-09-10 DIAGNOSIS — H2181 Floppy iris syndrome: Secondary | ICD-10-CM | POA: Insufficient documentation

## 2024-09-10 DIAGNOSIS — H2511 Age-related nuclear cataract, right eye: Secondary | ICD-10-CM | POA: Insufficient documentation

## 2024-09-10 DIAGNOSIS — I10 Essential (primary) hypertension: Secondary | ICD-10-CM | POA: Diagnosis not present

## 2024-09-10 DIAGNOSIS — E1136 Type 2 diabetes mellitus with diabetic cataract: Secondary | ICD-10-CM | POA: Diagnosis not present

## 2024-09-10 DIAGNOSIS — Z7984 Long term (current) use of oral hypoglycemic drugs: Secondary | ICD-10-CM | POA: Insufficient documentation

## 2024-09-10 DIAGNOSIS — Z87891 Personal history of nicotine dependence: Secondary | ICD-10-CM | POA: Insufficient documentation

## 2024-09-10 DIAGNOSIS — J449 Chronic obstructive pulmonary disease, unspecified: Secondary | ICD-10-CM | POA: Diagnosis not present

## 2024-09-10 DIAGNOSIS — I2510 Atherosclerotic heart disease of native coronary artery without angina pectoris: Secondary | ICD-10-CM | POA: Insufficient documentation

## 2024-09-10 HISTORY — PX: CATARACT EXTRACTION W/PHACO: SHX586

## 2024-09-10 HISTORY — DX: Unspecified osteoarthritis, unspecified site: M19.90

## 2024-09-10 HISTORY — DX: Other ill-defined heart diseases: I51.89

## 2024-09-10 HISTORY — DX: Dyspnea, unspecified: R06.00

## 2024-09-10 HISTORY — DX: Chronic obstructive pulmonary disease, unspecified: J44.9

## 2024-09-10 HISTORY — DX: Atherosclerotic heart disease of native coronary artery without angina pectoris: I25.10

## 2024-09-10 HISTORY — DX: Atherosclerosis of aorta: I70.0

## 2024-09-10 HISTORY — DX: Nonrheumatic mitral (valve) insufficiency: I34.0

## 2024-09-10 HISTORY — DX: Type 2 diabetes mellitus without complications: E11.9

## 2024-09-10 LAB — GLUCOSE, CAPILLARY: Glucose-Capillary: 110 mg/dL — ABNORMAL HIGH (ref 70–99)

## 2024-09-10 SURGERY — PHACOEMULSIFICATION, CATARACT, WITH IOL INSERTION
Anesthesia: Monitor Anesthesia Care | Laterality: Right

## 2024-09-10 MED ORDER — LACTATED RINGERS IV SOLN
INTRAVENOUS | Status: DC
Start: 2024-09-10 — End: 2024-09-10

## 2024-09-10 MED ORDER — MIDAZOLAM HCL 2 MG/2ML IJ SOLN
INTRAMUSCULAR | Status: DC | PRN
  Administered 2024-09-10 (×2): 1 mg via INTRAVENOUS

## 2024-09-10 MED ORDER — SIGHTPATH DOSE#1 BSS IO SOLN
INTRAOCULAR | Status: DC | PRN
  Administered 2024-09-10: 105 mL via OPHTHALMIC

## 2024-09-10 MED ORDER — LIDOCAINE HCL (PF) 2 % IJ SOLN
INTRAOCULAR | Status: DC | PRN
  Administered 2024-09-10: 1 mL via INTRAOCULAR

## 2024-09-10 MED ORDER — SIGHTPATH DOSE#1 BSS IO SOLN
INTRAOCULAR | Status: DC | PRN
  Administered 2024-09-10: 15 mL via INTRAOCULAR

## 2024-09-10 MED ORDER — TETRACAINE HCL 0.5 % OP SOLN
OPHTHALMIC | Status: AC
Start: 2024-09-10 — End: 2024-09-10
  Filled 2024-09-10: qty 4

## 2024-09-10 MED ORDER — FENTANYL CITRATE (PF) 100 MCG/2ML IJ SOLN
INTRAMUSCULAR | Status: AC
  Filled 2024-09-10: qty 2

## 2024-09-10 MED ORDER — MIDAZOLAM HCL 2 MG/2ML IJ SOLN
INTRAMUSCULAR | Status: AC
  Filled 2024-09-10: qty 2

## 2024-09-10 MED ORDER — MOXIFLOXACIN HCL 0.5 % OP SOLN
OPHTHALMIC | Status: DC | PRN
  Administered 2024-09-10: .2 mL via OPHTHALMIC

## 2024-09-10 MED ORDER — ARMC OPHTHALMIC DILATING DROPS
OPHTHALMIC | Status: AC
  Filled 2024-09-10: qty 0.5

## 2024-09-10 MED ORDER — TETRACAINE HCL 0.5 % OP SOLN
1.0000 [drp] | OPHTHALMIC | Status: DC | PRN
  Administered 2024-09-10 (×3): 1 [drp] via OPHTHALMIC

## 2024-09-10 MED ORDER — SIGHTPATH DOSE#1 NA HYALUR & NA CHOND-NA HYALUR IO KIT
PACK | INTRAOCULAR | Status: DC | PRN
  Administered 2024-09-10: 1 via OPHTHALMIC

## 2024-09-10 MED ORDER — ARMC OPHTHALMIC DILATING DROPS
1.0000 | OPHTHALMIC | Status: DC | PRN
  Administered 2024-09-10 (×3): 1 via OPHTHALMIC

## 2024-09-10 MED ORDER — FENTANYL CITRATE (PF) 100 MCG/2ML IJ SOLN
INTRAMUSCULAR | Status: DC | PRN
  Administered 2024-09-10 (×2): 50 ug via INTRAVENOUS

## 2024-09-10 SURGICAL SUPPLY — 10 items
DISSECTOR HYDRO NUCLEUS 50X22 (MISCELLANEOUS) ×1 IMPLANT
FEE CATARACT SUITE SIGHTPATH (MISCELLANEOUS) ×1 IMPLANT
GLOVE PI ULTRA LF STRL 7.5 (GLOVE) ×1 IMPLANT
GLOVE SURG SYN 6.5 PF PI BL (GLOVE) ×1 IMPLANT
GLOVE SURG SYN 8.5 PF PI BL (GLOVE) ×1 IMPLANT
LENS IOL TECNIS EYHANCE 22.0 (Intraocular Lens) IMPLANT
NDL FILTER BLUNT 18X1 1/2 (NEEDLE) ×1 IMPLANT
NEEDLE FILTER BLUNT 18X1 1/2 (NEEDLE) ×1 IMPLANT
RING MALYGIN (MISCELLANEOUS) IMPLANT
SYR 3ML LL SCALE MARK (SYRINGE) ×1 IMPLANT

## 2024-09-10 NOTE — Transfer of Care (Signed)
 Immediate Anesthesia Transfer of Care Note  Patient: Judy Martin  Procedure(s) Performed: PHACOEMULSIFICATION, CATARACT, WITH IOL INSERTION 2.60, 00:32.9 (Right)  Patient Location: PACU  Anesthesia Type: MAC  Level of Consciousness: awake, alert  and patient cooperative  Airway and Oxygen Therapy: Patient Spontanous Breathing and Patient connected to supplemental oxygen  Post-op Assessment: Post-op Vital signs reviewed, Patient's Cardiovascular Status Stable, Respiratory Function Stable, Patent Airway and No signs of Nausea or vomiting  Post-op Vital Signs: Reviewed and stable  Complications: No notable events documented.

## 2024-09-10 NOTE — Op Note (Addendum)
 OPERATIVE NOTE  Judy Martin [id number] 09/10/2024   PREOPERATIVE DIAGNOSIS:  Nuclear sclerotic cataract right eye.  H25.11   POSTOPERATIVE DIAGNOSIS:      Nuclear sclerotic cataract right eye.   Intraoperative floppy iris syndrome   PROCEDURE:  CPT [phone] Complex Phacoemusification with posterior chamber intraocular lens placement of the right eye  requiring malyugin ring for dilation and stabilization of the iris  LENS:   Implant Name Type Inv. Item Serial No. Manufacturer Lot No. LRB No. Used Action  LENS IOL TECNIS EYHANCE 22.0 - [id number] Intraocular Lens LENS IOL TECNIS EYHANCE 22.0 [id number] [employer]IGHT[employer]  Right 1 Implanted       Procedure(s): PHACOEMULSIFICATION, CATARACT, WITH IOL INSERTION 2.60, 00:32.9 (Right)  SURGEON:  Adine Novak, MD, MPH  ANESTHESIOLOGIST: Anesthesiologist: Ola Donny BROCKS, MD CRNA: Jahoo, Sonia, CRNA   ANESTHESIA:  Topical with tetracaine  drops augmented with 1% preservative-free intracameral lidocaine .  ESTIMATED BLOOD LOSS: less than 1 mL.   COMPLICATIONS:  None.   DESCRIPTION OF PROCEDURE:  The patient was identified in the holding room and transported to the operating room and placed in the supine position under the operating microscope.  The right eye was identified as the operative eye and it was prepped and draped in the usual sterile ophthalmic fashion.   A 1.0 millimeter clear-corneal paracentesis was made at the 10:30 position. 0.5 ml of preservative-free 1% lidocaine  with epinephrine  was injected into the anterior chamber.  The anterior chamber was filled with viscoelastic.  A 2.4 millimeter keratome was used to make a near-clear corneal incision at the 8:00 position.   The pupil was small and floppy, so a ring was placed.  A curvilinear capsulorrhexis was made with a cystotome and capsulorrhexis forceps.  Balanced salt solution was used to hydrodissect and hydrodelineate the nucleus.   Phacoemulsification was then used in stop and  chop fashion to remove the lens nucleus and epinucleus.  The remaining cortex was then removed using the irrigation and aspiration handpiece. Viscoelastic was then placed into the capsular bag to distend it for lens placement.  A lens was then injected into the capsular bag.  The ring was removed. The remaining viscoelastic was aspirated.   Wounds were hydrated with balanced salt solution.  The anterior chamber was inflated to a physiologic pressure with balanced salt solution.   Intracameral vigamox  0.1 mL undiluted was injected into the eye and a drop placed onto the ocular surface.  No wound leaks were noted.  The patient was taken to the recovery room in stable condition without complications of anesthesia or surgery  Adine Novak 09/10/2024, 8:25 AM

## 2024-09-10 NOTE — Anesthesia Postprocedure Evaluation (Signed)
 Anesthesia Post Note  Patient: Dietitian  Procedure(s) Performed: PHACOEMULSIFICATION, CATARACT, WITH IOL INSERTION 2.60, 00:32.9 (Right)  Patient location during evaluation: PACU Anesthesia Type: MAC Level of consciousness: awake and alert Pain management: pain level controlled Vital Signs Assessment: post-procedure vital signs reviewed and stable Respiratory status: spontaneous breathing, nonlabored ventilation, respiratory function stable and patient connected to nasal cannula oxygen Cardiovascular status: stable and blood pressure returned to baseline Postop Assessment: no apparent nausea or vomiting Anesthetic complications: no   No notable events documented.   Last Vitals:  Vitals:   09/10/24 0826 09/10/24 0831  BP: 134/76 (!) 149/91  Pulse: 65 63  Resp: 15 12  Temp: (!) 36.2 C (!) 36.2 C  SpO2: 98% 95%    Last Pain:  Vitals:   09/10/24 0831  TempSrc:   PainSc: 0-No pain                 Judy Martin [name]

## 2024-09-10 NOTE — H&P (Signed)
 [facility]   Primary Care Physician:  Katrinka Aquas, MD Ophthalmologist: Dr. Adine Novak  Pre-Procedure History & Physical: HPI:  Judy Martin is a 68 y.o. female here for cataract surgery.   Past Medical History:  Diagnosis Date   Aortic atherosclerosis (HCC)    Arthritis    CAD (coronary artery disease)    CAD (coronary artery disease)    COPD (chronic obstructive pulmonary disease) (HCC)    Diabetes mellitus without complication (HCC)    Dyspnea    Grade I diastolic dysfunction    Hypercholesteremia    Hypertension    Mild mitral regurgitation by prior echocardiogram     Past Surgical History:  Procedure Laterality Date   ABDOMINAL HYSTERECTOMY     COLONOSCOPY WITH PROPOFOL  N/A 11/14/2023   Procedure: COLONOSCOPY WITH PROPOFOL ;  Surgeon: Unk Corinn Skiff, MD;  Location: [facility] ENDOSCOPY;  Service: Gastroenterology;  Laterality: N/A;    Prior to Admission medications   Medication Sig Start Date End Date Taking? Authorizing Provider  amLODipine  (NORVASC ) 10 MG tablet Take 10 mg by mouth at bedtime.   Yes [provider]  ANORO ELLIPTA  62.5-25 MCG/ACT AEPB INHALE ONE PUFF BY MOUTH ONCE DAILY 06/23/24  Yes Dgayli, Belva, MD  aspirin  EC 81 MG tablet Take 81 mg by mouth daily.   Yes [provider]  atenolol  (TENORMIN ) 100 MG tablet Take 100 mg by mouth daily.   Yes [provider]  atorvastatin  (LIPITOR) 80 MG tablet Take 80 mg by mouth at bedtime.   Yes [provider]  insulin  glargine (LANTUS  SOLOSTAR) 100 UNIT/ML Solostar Pen Inject 25 Units into the skin daily. May substitute as needed per insurance. 07/18/24  Yes Laurita Pillion, MD  insulin  lispro (HUMALOG  KWIKPEN) 100 UNIT/ML KwikPen Inject 6 Units into the skin 3 (three) times daily. 07/18/24  Yes Zhang, Dekui, MD  metFORMIN (GLUCOPHAGE) 500 MG tablet Take 500 mg by mouth 2 (two) times daily.   Yes [provider]  potassium chloride  (KLOR-CON  M) 10 MEQ tablet Take  10 mEq by mouth daily.   Yes [provider]  valsartan (DIOVAN) 320 MG tablet Take 320 mg by mouth daily.   Yes [provider]  Vitamin D, Ergocalciferol, (DRISDOL) 1.25 MG (50000 UNIT) CAPS capsule Take 50,000 Units by mouth every 7 (seven) days.   Yes [provider]  Accu-Chek Softclix Lancets lancets 1 each 3 (three) times daily. Use as directed to check blood sugar. May dispense any manufacturer covered by patient's insurance and fits patient's device. 07/18/24   Laurita Pillion, MD  Blood Glucose Monitoring Suppl (BLOOD GLUCOSE MONITOR SYSTEM) w/Device KIT 1 each by Does not apply route 3 (three) times daily. May dispense any manufacturer covered by patient's insurance. 07/18/24   Laurita Pillion, MD  Glucose Blood (BLOOD GLUCOSE TEST STRIPS) STRP 1 each by Does not apply route 3 (three) times daily. Use as directed to check blood sugar. May dispense any manufacturer covered by patient's insurance and fits patient's device. 07/18/24   Laurita Pillion, MD  Insulin  Pen Needle (PEN NEEDLES) 31G X 8 MM MISC 1 each by Does not apply route 3 (three) times daily. May dispense any manufacturer covered by patient's insurance. 07/18/24   Laurita Pillion, MD  Lancet Device MISC 1 each by Does not apply route 3 (three) times daily. May dispense any manufacturer covered by patient's insurance. 07/18/24   Laurita Pillion, MD    Allergies as of 08/22/2024   (No Known Allergies)  Family History  Problem Relation Age of Onset   Diabetes Mother    Heart attack Mother    Stroke Brother     Social History   Socioeconomic History   Marital status: Divorced    Spouse name: Not on file   Number of children: Not on file   Years of education: Not on file   Highest education level: Not on file  Occupational History   Not on file  Tobacco Use   Smoking status: Former    Current packs/day: 0.50    Average packs/day: 0.5 packs/day for 25.0 years (12.5 ttl pk-yrs)    Types: Cigarettes     Passive exposure: Past   Smokeless tobacco: Never  Vaping Use   Vaping status: Never Used  Substance and Sexual Activity   Alcohol use: Not Currently   Drug use: Never   Sexual activity: Not Currently  Other Topics Concern   Not on file  Social History Narrative   Not on file   Social Drivers of Health   Financial Resource Strain: Not on file  Food Insecurity: No Food Insecurity (01/26/2023)   Hunger Vital Sign    Worried About Running Out of Food in the Last Year: Never true    Ran Out of Food in the Last Year: Never true  Transportation Needs: No Transportation Needs (01/26/2023)   PRAPARE - Administrator, Civil Service (Medical): No    Lack of Transportation (Non-Medical): No  Physical Activity: Not on file  Stress: Not on file  Social Connections: Not on file  Intimate Partner Violence: Not At Risk (01/26/2023)   Humiliation, Afraid, Rape, and Kick questionnaire    Fear of Current or Ex-Partner: No    Emotionally Abused: No    Physically Abused: No    Sexually Abused: No    Review of Systems: See HPI, otherwise negative ROS  Physical Exam: BP (!) 164/85   Temp (!) 97.5 F (36.4 C) (Temporal)   Resp 14   Ht 5' 5 (1.651 m)   Wt 62.2 kg   SpO2 98%   BMI 22.83 kg/m  General:   Alert, cooperative. Head:  Normocephalic and atraumatic. Respiratory:  Normal work of breathing. Cardiovascular:  NAD  Impression/Plan: Judy Martin is here for cataract surgery.  Risks, benefits, limitations, and alternatives regarding cataract surgery have been reviewed with the patient.  Questions have been answered.  All parties agreeable.   Adine Novak, MD  09/10/2024, 7:54 AM

## 2024-09-11 ENCOUNTER — Ambulatory Visit (INDEPENDENT_AMBULATORY_CARE_PROVIDER_SITE_OTHER): Admitting: Surgery

## 2024-09-11 ENCOUNTER — Encounter: Payer: Self-pay | Admitting: Surgery

## 2024-09-11 VITALS — BP 143/78 | HR 58 | Ht 65.0 in | Wt 131.0 lb

## 2024-09-11 DIAGNOSIS — L723 Sebaceous cyst: Secondary | ICD-10-CM

## 2024-09-11 DIAGNOSIS — Z09 Encounter for follow-up examination after completed treatment for conditions other than malignant neoplasm: Secondary | ICD-10-CM

## 2024-09-11 NOTE — Patient Instructions (Addendum)
 Follow-up with our office as needed.  Please call and ask to speak with a nurse if you develop questions or concerns.   Suture Removal, Care After The following information offers guidance on how to care for yourself after your procedure. Your health care provider may also give you more specific instructions. If you have problems or questions, contact your health care provider. What can I expect after the procedure? After your stitches (sutures) are removed, it is common to have: Some discomfort and swelling in the area. Slight redness in the area. Follow these instructions at home: If you have a dressing: Wash your hands with soap and water  for at least 20 seconds before and after you change your bandage (dressing). If soap and water  are not available, use hand sanitizer. Change your dressing as told by your health care provider. If your dressing becomes wet or dirty, or develops a bad smell, change it as soon as possible. If your dressing sticks to your skin, pour warm, clean water  over it until it loosens and can be removed without pulling apart the wound edges. Pat the area dry with a soft, clean towel. Do not rub the wound because that may cause bleeding. Wound care  Check your wound every day for signs of infection. Check for: More redness, swelling, or pain. Fluid or blood. New warmth, a rash, or hardness at the wound site. Pus or a bad smell. Wash your hands with soap and water  for at least 20 seconds before and after touching your wound. If soap and water  are not available, use hand sanitizer. Keep the wound area dry and clean. Clean and pat the wound dry as told by your health care provider. Apply cream or ointment only as told by your health care provider. If skin glue or adhesive strips were applied after sutures were removed, leave these closures in place. They may need to stay in place for 2 weeks or longer. If adhesive strip edges start to loosen and curl up, you may trim the  loose edges. Do not remove adhesive strips completely unless your health care provider tells you to do that. Continue to protect the wound from injury. Do not pick at your wound. Picking can cause an infection. Bathing Do not take baths, swim, or use a hot tub until your health care provider approves. Ask your health care provider if you may take showers. Follow these steps for showering: If you have a dressing, remove it before getting into the shower. In the shower, allow soapy water  to get on the wound. Avoid scrubbing the wound. When you get out of the shower, dry the wound by patting it with a clean towel. Reapply a dressing over the wound, if needed. Scar care When your wound has completely healed, help decrease the size of your scar by: Wearing sunscreen over the scar or covering it with clothing when you are outside. New scars get sunburned easily, which can make scarring worse. Gently massaging the scarred area. This can decrease scar thickness. General instructions Take over-the-counter and prescription medicines only as told by your health care provider. Keep all follow-up visits. This is important. Contact a health care provider if: You have more redness, swelling, or pain around your wound. You have fluid or blood coming from your wound. You have new warmth, a rash, or hardness at the wound site. You have pus or a bad smell coming from your wound. Your wound opens up. Get help right away if: You have a  fever or chills. You have red streaks coming from your wound. Summary After your sutures are removed, it is common to have some discomfort and swelling in the area. Wash your hands with soap and water  before you change your bandage (dressing). Keep the wound area dry and clean. Do not take baths, swim, or use a hot tub until your health care provider approves. This information is not intended to replace advice given to you by your health care provider. Make sure you discuss  any questions you have with your health care provider. Document Revised: 04/07/2021 Document Reviewed: 04/07/2021 Elsevier Patient Education  2024 [employer].

## 2024-09-11 NOTE — Progress Notes (Signed)
 [facility] POST-OP OFFICE VISIT  09/11/2024  HPI: Judy Martin is a 68 y.o. female had surgery 5 days ago, now s/p excision of left ear sebaceous cyst.  Doing well.  Here for suture removal.  Vital signs: BP (!) 143/78   Pulse (!) 58   Ht 5' 5 (1.651 m)   Wt 131 lb (59.4 kg)   SpO2 98%   BMI 21.80 kg/m    Physical Exam: Constitutional: Appears well.  Skin: Left earlobe with 2 fine nylon sutures present.  No evidence of hematoma, erythema or discharge.  Assessment/Plan: This is a 68 y.o. female status post excision of left earlobe sebaceous cyst.  Progressing well.  Sutures removed.  Advised to provide delicate care to ear, and follow-up as needed.  Patient Active Problem List   Diagnosis Date Noted   Hyperglycemic hyperosmolar nonketotic coma (HCC) 07/18/2024   Hyperosmolar hyperglycemic state (HHS) (HCC) 07/17/2024   Pre-diabetes 07/17/2024   Alcohol use 07/17/2024   Encounter for screening colonoscopy 11/14/2023   Rectal polyp 11/14/2023   Polyp of transverse colon 11/14/2023   Polyp of descending colon 11/14/2023   Bronchiectasis (HCC) 01/27/2023   Acute hypoxic respiratory failure (HCC) 01/26/2023   Hypokalemia 01/26/2023   Polycythemia 01/26/2023   Hypercholesteremia    Essential hypertension 09/26/2014   Palpitations 09/26/2014       Honor Leghorn M.D., FACS 09/11/2024, 11:26 AM

## 2024-09-19 ENCOUNTER — Encounter: Payer: Self-pay | Admitting: Ophthalmology

## 2024-09-20 NOTE — Discharge Instructions (Signed)

## 2024-09-24 ENCOUNTER — Ambulatory Visit: Payer: Self-pay | Admitting: Anesthesiology

## 2024-09-24 ENCOUNTER — Encounter: Payer: Self-pay | Admitting: Ophthalmology

## 2024-09-24 ENCOUNTER — Encounter
Admission: RE | Disposition: A | Discharge status: Home / Self Care | Payer: Self-pay | Source: Home / Self Care | Attending: Ophthalmology

## 2024-09-24 ENCOUNTER — Other Ambulatory Visit: Payer: Self-pay

## 2024-09-24 ENCOUNTER — Ambulatory Visit
Admission: RE | Admit: 2024-09-24 | Discharge: 2024-09-24 | Disposition: A | Discharge status: Home / Self Care | Attending: Ophthalmology | Admitting: Ophthalmology

## 2024-09-24 DIAGNOSIS — Z87891 Personal history of nicotine dependence: Secondary | ICD-10-CM | POA: Insufficient documentation

## 2024-09-24 DIAGNOSIS — H2512 Age-related nuclear cataract, left eye: Secondary | ICD-10-CM | POA: Insufficient documentation

## 2024-09-24 DIAGNOSIS — E1136 Type 2 diabetes mellitus with diabetic cataract: Secondary | ICD-10-CM | POA: Insufficient documentation

## 2024-09-24 DIAGNOSIS — I10 Essential (primary) hypertension: Secondary | ICD-10-CM | POA: Insufficient documentation

## 2024-09-24 DIAGNOSIS — J449 Chronic obstructive pulmonary disease, unspecified: Secondary | ICD-10-CM | POA: Insufficient documentation

## 2024-09-24 DIAGNOSIS — I2510 Atherosclerotic heart disease of native coronary artery without angina pectoris: Secondary | ICD-10-CM | POA: Diagnosis not present

## 2024-09-24 DIAGNOSIS — Z7984 Long term (current) use of oral hypoglycemic drugs: Secondary | ICD-10-CM | POA: Insufficient documentation

## 2024-09-24 DIAGNOSIS — Z794 Long term (current) use of insulin: Secondary | ICD-10-CM | POA: Insufficient documentation

## 2024-09-24 DIAGNOSIS — H2181 Floppy iris syndrome: Secondary | ICD-10-CM | POA: Insufficient documentation

## 2024-09-24 HISTORY — PX: CATARACT EXTRACTION W/PHACO: SHX586

## 2024-09-24 LAB — GLUCOSE, CAPILLARY: Glucose-Capillary: 107 mg/dL — ABNORMAL HIGH (ref 70–99)

## 2024-09-24 SURGERY — PHACOEMULSIFICATION, CATARACT, WITH IOL INSERTION
Anesthesia: Monitor Anesthesia Care | Laterality: Left

## 2024-09-24 MED ORDER — MIDAZOLAM HCL 2 MG/2ML IJ SOLN
INTRAMUSCULAR | Status: AC
  Filled 2024-09-24: qty 2

## 2024-09-24 MED ORDER — LACTATED RINGERS IV SOLN: INTRAVENOUS | Status: DC

## 2024-09-24 MED ORDER — SIGHTPATH DOSE#1 BSS IO SOLN
INTRAOCULAR | Status: DC | PRN
  Administered 2024-09-24: 78 mL via OPHTHALMIC

## 2024-09-24 MED ORDER — LIDOCAINE HCL (PF) 2 % IJ SOLN
INTRAOCULAR | Status: DC | PRN
  Administered 2024-09-24: 1 mL via INTRAOCULAR

## 2024-09-24 MED ORDER — ARMC OPHTHALMIC DILATING DROPS
1.0000 | OPHTHALMIC | Status: DC | PRN
  Administered 2024-09-24 (×3): 1 via OPHTHALMIC

## 2024-09-24 MED ORDER — FENTANYL CITRATE (PF) 100 MCG/2ML IJ SOLN
INTRAMUSCULAR | Status: AC
  Filled 2024-09-24: qty 2

## 2024-09-24 MED ORDER — MOXIFLOXACIN HCL 0.5 % OP SOLN
OPHTHALMIC | Status: DC | PRN
  Administered 2024-09-24: .2 mL via OPHTHALMIC

## 2024-09-24 MED ORDER — SIGHTPATH DOSE#1 NA HYALUR & NA CHOND-NA HYALUR IO KIT
PACK | INTRAOCULAR | Status: DC | PRN
  Administered 2024-09-24: 1 via OPHTHALMIC

## 2024-09-24 MED ORDER — TETRACAINE HCL 0.5 % OP SOLN
1.0000 [drp] | OPHTHALMIC | Status: DC | PRN
  Administered 2024-09-24 (×3): 1 [drp] via OPHTHALMIC

## 2024-09-24 MED ORDER — FENTANYL CITRATE (PF) 100 MCG/2ML IJ SOLN
INTRAMUSCULAR | Status: DC | PRN
  Administered 2024-09-24 (×2): 50 ug via INTRAVENOUS

## 2024-09-24 MED ORDER — MIDAZOLAM HCL 2 MG/2ML IJ SOLN
INTRAMUSCULAR | Status: DC | PRN
  Administered 2024-09-24: 2 mg via INTRAVENOUS

## 2024-09-24 MED ORDER — TETRACAINE HCL 0.5 % OP SOLN
OPHTHALMIC | Status: AC
  Filled 2024-09-24: qty 4

## 2024-09-24 MED ORDER — SIGHTPATH DOSE#1 BSS IO SOLN
INTRAOCULAR | Status: DC | PRN
  Administered 2024-09-24: 15 mL via INTRAOCULAR

## 2024-09-24 MED ORDER — ARMC OPHTHALMIC DILATING DROPS
OPHTHALMIC | Status: AC
  Filled 2024-09-24: qty 0.5

## 2024-09-24 SURGICAL SUPPLY — 9 items
FEE CATARACT SUITE SIGHTPATH (MISCELLANEOUS) ×1 IMPLANT
GLOVE PI ULTRA LF STRL 7.5 (GLOVE) ×1 IMPLANT
GLOVE SURG SYN 6.5 PF PI BL (GLOVE) ×1 IMPLANT
GLOVE SURG SYN 8.5 PF PI BL (GLOVE) ×1 IMPLANT
LENS IOL TECNIS EYHANCE 22.0 (Intraocular Lens) IMPLANT
NDL FILTER BLUNT 18X1 1/2 (NEEDLE) ×1 IMPLANT
NEEDLE FILTER BLUNT 18X1 1/2 (NEEDLE) ×1 IMPLANT
RING MALYGIN (MISCELLANEOUS) IMPLANT
SYR 3ML LL SCALE MARK (SYRINGE) ×1 IMPLANT

## 2024-09-24 NOTE — Anesthesia Postprocedure Evaluation (Signed)
 Anesthesia Post Note  Patient: Dietitian  Procedure(s) Performed: PHACOEMULSIFICATION, CATARACT, WITH IOL INSERTION 3.87, 00:36.0 (Left)  Patient location during evaluation: PACU Anesthesia Type: MAC Level of consciousness: awake and alert Pain management: pain level controlled Vital Signs Assessment: post-procedure vital signs reviewed and stable Respiratory status: spontaneous breathing, nonlabored ventilation, respiratory function stable and patient connected to nasal cannula oxygen Cardiovascular status: stable and blood pressure returned to baseline Postop Assessment: no apparent nausea or vomiting Anesthetic complications: no   No notable events documented.   Last Vitals:  Vitals:   09/24/24 0755 09/24/24 0800  BP: 123/84 (!) 154/78  Pulse: 65 (!) 58  Resp: 14 (!) 21  Temp: (!) 36.3 C (!) 36.3 C  SpO2: 95% 94%    Last Pain:  Vitals:   09/24/24 0800  TempSrc:   PainSc: 0-No pain                 Atiyana Welte C Mariaguadalupe Fialkowski

## 2024-09-24 NOTE — Op Note (Signed)
 OPERATIVE NOTE  Abraham Margulies [id number] 09/24/2024   PREOPERATIVE DIAGNOSIS:  Nuclear sclerotic cataract left eye.  H25.12   POSTOPERATIVE DIAGNOSIS:      Nuclear sclerotic cataract left eye.   Intraoperative Floppy iris syndrome   PROCEDURE:  CPT [phone] Complex Phacoemusification with posterior chamber intraocular lens placement of the left eye, requiring use of malyugin ring for dilation and stabilization of the iris  LENS:   Implant Name Type Inv. Item Serial No. Manufacturer Lot No. LRB No. Used Action  LENS IOL TECNIS EYHANCE 22.0 - [id number] Intraocular Lens LENS IOL TECNIS EYHANCE 22.0 [id number] [employer]  Left 1 Implanted      Procedure(s): PHACOEMULSIFICATION, CATARACT, WITH IOL INSERTION 3.87, 00:36.0 (Left)    SURGEON:  Adine Novak, MD, MPH   ANESTHESIA:  Topical with tetracaine  drops augmented with 1% preservative-free intracameral lidocaine .  ESTIMATED BLOOD LOSS: <1 mL   COMPLICATIONS:  None.   DESCRIPTION OF PROCEDURE:  The patient was identified in the holding room and transported to the operating room and placed in the supine position under the operating microscope.  The left eye was identified as the operative eye and it was prepped and draped in the usual sterile ophthalmic fashion.   A 1.0 millimeter clear-corneal paracentesis was made at the 5:00 position. 0.5 ml of preservative-free 1% lidocaine  with epinephrine  was injected into the anterior chamber.  The anterior chamber was filled with viscoelastic.  A 2.4 millimeter keratome was used to make a near-clear corneal incision at the 2:00 position.  The pupil was small, so a malyugin ring was placed. A curvilinear capsulorrhexis was made with a cystotome and capsulorrhexis forceps.  Balanced salt solution was used to hydrodissect and hydrodelineate the nucleus.   Phacoemulsification was then used in stop and chop fashion to remove the lens nucleus and epinucleus.  The remaining cortex was then removed using  the irrigation and aspiration handpiece. Viscoelastic was then placed into the capsular bag to distend it for lens placement.  A lens was then injected into the capsular bag.  The ring was removed. The remaining viscoelastic was aspirated.   Wounds were hydrated with balanced salt solution.  The anterior chamber was inflated to a physiologic pressure with balanced salt solution.  Intracameral vigamox  0.1 mL undiltued was injected into the eye and a drop placed onto the ocular surface.  No wound leaks were noted.  The patient was taken to the recovery room in stable condition without complications of anesthesia or surgery  Adine Novak 09/24/2024, 7:54 AM

## 2024-09-24 NOTE — Transfer of Care (Signed)
 Immediate Anesthesia Transfer of Care Note  Patient: Judy Martin  Procedure(s) Performed: PHACOEMULSIFICATION, CATARACT, WITH IOL INSERTION 3.87, 00:36.0 (Left)  Patient Location: PACU  Anesthesia Type:MAC  Level of Consciousness: awake  Airway & Oxygen Therapy: Patient Spontanous Breathing  Post-op Assessment: Report given to RN  Post vital signs: Reviewed and stable  Last Vitals:  Vitals Value Taken Time  BP    Temp    Pulse 62 09/24/24 07:56  Resp 14 09/24/24 07:56  SpO2 95 % 09/24/24 07:56  Vitals shown include unfiled device data.  Last Pain:  Vitals:   09/24/24 0643  TempSrc: Temporal  PainSc: 0-No pain         Complications: No notable events documented.

## 2024-09-24 NOTE — H&P (Signed)
 [facility]   Primary Care Physician:  Katrinka Aquas, MD Ophthalmologist: Dr. Adine Novak  Pre-Procedure History & Physical: HPI:  Judy Martin is a 68 y.o. female here for cataract surgery.   Past Medical History:  Diagnosis Date   Aortic atherosclerosis    Arthritis    CAD (coronary artery disease)    CAD (coronary artery disease)    COPD (chronic obstructive pulmonary disease) (HCC)    Diabetes mellitus without complication (HCC)    Dyspnea    Grade I diastolic dysfunction    Hypercholesteremia    Hypertension    Mild mitral regurgitation by prior echocardiogram     Past Surgical History:  Procedure Laterality Date   ABDOMINAL HYSTERECTOMY     CATARACT EXTRACTION W/PHACO Right 09/10/2024   Procedure: PHACOEMULSIFICATION, CATARACT, WITH IOL INSERTION 2.60, 00:32.9;  Surgeon: Novak Adine Anes, MD;  Location: [facility];  Service: Ophthalmology;  Laterality: Right;   COLONOSCOPY WITH PROPOFOL  N/A 11/14/2023   Procedure: COLONOSCOPY WITH PROPOFOL ;  Surgeon: Unk Corinn Skiff, MD;  Location: [facility];  Service: Gastroenterology;  Laterality: N/A;    Prior to Admission medications   Medication Sig Start Date End Date Taking? Authorizing Provider  Accu-Chek Softclix Lancets lancets 1 each 3 (three) times daily. Use as directed to check blood sugar. May dispense any manufacturer covered by patient's insurance and fits patient's device. 07/18/24  Yes Laurita Pillion, MD  amLODipine  (NORVASC ) 10 MG tablet Take 10 mg by mouth at bedtime.   Yes [provider]  ANORO ELLIPTA  62.5-25 MCG/ACT AEPB INHALE ONE PUFF BY MOUTH ONCE DAILY 06/23/24  Yes Dgayli, Belva, MD  aspirin  EC 81 MG tablet Take 81 mg by mouth daily.   Yes [provider]  atenolol  (TENORMIN ) 100 MG tablet Take 100 mg by mouth daily.   Yes [provider]  atorvastatin  (LIPITOR) 80 MG tablet Take 80 mg by mouth at bedtime.   Yes [provider]  Blood Glucose  Monitoring Suppl (BLOOD GLUCOSE MONITOR SYSTEM) w/Device KIT 1 each by Does not apply route 3 (three) times daily. May dispense any manufacturer covered by patient's insurance. 07/18/24  Yes Laurita Pillion, MD  Glucose Blood (BLOOD GLUCOSE TEST STRIPS) STRP 1 each by Does not apply route 3 (three) times daily. Use as directed to check blood sugar. May dispense any manufacturer covered by patient's insurance and fits patient's device. 07/18/24  Yes Laurita Pillion, MD  insulin  glargine (LANTUS  SOLOSTAR) 100 UNIT/ML Solostar Pen Inject 25 Units into the skin daily. May substitute as needed per insurance. 07/18/24  Yes Laurita Pillion, MD  insulin  lispro (HUMALOG  KWIKPEN) 100 UNIT/ML KwikPen Inject 6 Units into the skin 3 (three) times daily. 07/18/24  Yes Laurita Pillion, MD  Insulin  Pen Needle (PEN NEEDLES) 31G X 8 MM MISC 1 each by Does not apply route 3 (three) times daily. May dispense any manufacturer covered by patient's insurance. 07/18/24  Yes Laurita Pillion, MD  Lancet Device MISC 1 each by Does not apply route 3 (three) times daily. May dispense any manufacturer covered by patient's insurance. 07/18/24  Yes Zhang, Dekui, MD  metFORMIN (GLUCOPHAGE) 500 MG tablet Take 500 mg by mouth 2 (two) times daily.   Yes [provider]  potassium chloride  (KLOR-CON  M) 10 MEQ tablet Take 10 mEq by mouth daily.   Yes [provider]  valsartan (DIOVAN) 320 MG tablet Take 320 mg by mouth daily.   Yes [provider]  Vitamin D, Ergocalciferol, (DRISDOL) 1.25 MG (  50000 UNIT) CAPS capsule Take 50,000 Units by mouth every 7 (seven) days.   Yes [provider]    Allergies as of 08/22/2024   (No Known Allergies)    Family History  Problem Relation Age of Onset   Diabetes Mother    Heart attack Mother    Stroke Brother     Social History   Socioeconomic History   Marital status: Divorced    Spouse name: Not on file   Number of children: Not on file   Years of education: Not on file    Highest education level: Not on file  Occupational History   Not on file  Tobacco Use   Smoking status: Former    Current packs/day: 0.50    Average packs/day: 0.5 packs/day for 25.0 years (12.5 ttl pk-yrs)    Types: Cigarettes    Passive exposure: Past   Smokeless tobacco: Never  Vaping Use   Vaping status: Never Used  Substance and Sexual Activity   Alcohol use: Not Currently   Drug use: Never   Sexual activity: Not Currently  Other Topics Concern   Not on file  Social History Narrative   Not on file   Social Drivers of Health   Financial Resource Strain: Not on file  Food Insecurity: No Food Insecurity (01/26/2023)   Hunger Vital Sign    Worried About Running Out of Food in the Last Year: Never true    Ran Out of Food in the Last Year: Never true  Transportation Needs: No Transportation Needs (01/26/2023)   PRAPARE - Administrator, Civil Service (Medical): No    Lack of Transportation (Non-Medical): No  Physical Activity: Not on file  Stress: Not on file  Social Connections: Not on file  Intimate Partner Violence: Not At Risk (01/26/2023)   Humiliation, Afraid, Rape, and Kick questionnaire    Fear of Current or Ex-Partner: No    Emotionally Abused: No    Physically Abused: No    Sexually Abused: No    Review of Systems: See HPI, otherwise negative ROS  Physical Exam: BP (!) 144/74   Pulse 61   Temp 97.8 F (36.6 C) (Temporal)   Ht 5' 5 (1.651 m)   Wt 62.1 kg   SpO2 96%   BMI 22.80 kg/m  General:   Alert, cooperative. Head:  Normocephalic and atraumatic. Respiratory:  Normal work of breathing. Cardiovascular:  NAD  Impression/Plan: Judy Martin is here for cataract surgery.  Risks, benefits, limitations, and alternatives regarding cataract surgery have been reviewed with the patient.  Questions have been answered.  All parties agreeable.   Adine Novak, MD  09/24/2024, 7:16 AM

## 2024-09-30 ENCOUNTER — Other Ambulatory Visit: Payer: Self-pay | Admitting: Student in an Organized Health Care Education/Training Program

## 2024-09-30 DIAGNOSIS — J42 Unspecified chronic bronchitis: Secondary | ICD-10-CM

## 2024-10-02 ENCOUNTER — Ambulatory Visit: Admitting: General Surgery

## 2024-11-24 ENCOUNTER — Other Ambulatory Visit: Payer: Self-pay | Admitting: Student in an Organized Health Care Education/Training Program

## 2024-11-24 DIAGNOSIS — J42 Unspecified chronic bronchitis: Secondary | ICD-10-CM

## 2025-01-10 ENCOUNTER — Ambulatory Visit
Admission: RE | Admit: 2025-01-10 | Discharge: 2025-01-10 | Disposition: A | Discharge status: Home / Self Care | Source: Ambulatory Visit | Attending: Internal Medicine | Admitting: Internal Medicine

## 2025-01-10 DIAGNOSIS — Z87891 Personal history of nicotine dependence: Secondary | ICD-10-CM | POA: Insufficient documentation

## 2025-01-10 DIAGNOSIS — Z122 Encounter for screening for malignant neoplasm of respiratory organs: Secondary | ICD-10-CM | POA: Diagnosis present

## 2025-01-10 DIAGNOSIS — F17210 Nicotine dependence, cigarettes, uncomplicated: Secondary | ICD-10-CM | POA: Diagnosis present

## 2025-01-21 ENCOUNTER — Other Ambulatory Visit: Payer: Self-pay

## 2025-01-21 DIAGNOSIS — Z122 Encounter for screening for malignant neoplasm of respiratory organs: Secondary | ICD-10-CM

## 2025-01-21 DIAGNOSIS — Z87891 Personal history of nicotine dependence: Secondary | ICD-10-CM

## 2025-01-21 DIAGNOSIS — F17210 Nicotine dependence, cigarettes, uncomplicated: Secondary | ICD-10-CM

## 2025-01-30 ENCOUNTER — Other Ambulatory Visit: Payer: Self-pay | Admitting: Student in an Organized Health Care Education/Training Program

## 2025-01-30 DIAGNOSIS — J42 Unspecified chronic bronchitis: Secondary | ICD-10-CM
